# Patient Record
Sex: Male | Born: 1965 | Race: White | Hispanic: No | Marital: Married | Smoking: Former smoker
Health system: Southern US, Community
[De-identification: ages and names within clinical notes are randomized; demographics above are authoritative.]

## PROBLEM LIST (undated history)

## (undated) DIAGNOSIS — Q231 Congenital insufficiency of aortic valve: Secondary | ICD-10-CM

## (undated) DIAGNOSIS — E781 Pure hyperglyceridemia: Secondary | ICD-10-CM

## (undated) DIAGNOSIS — E785 Hyperlipidemia, unspecified: Secondary | ICD-10-CM

## (undated) DIAGNOSIS — K589 Irritable bowel syndrome without diarrhea: Secondary | ICD-10-CM

## (undated) DIAGNOSIS — G4730 Sleep apnea, unspecified: Secondary | ICD-10-CM

## (undated) DIAGNOSIS — I2510 Atherosclerotic heart disease of native coronary artery without angina pectoris: Secondary | ICD-10-CM

## (undated) DIAGNOSIS — Z9289 Personal history of other medical treatment: Secondary | ICD-10-CM

## (undated) HISTORY — PX: HERNIA REPAIR: SHX51

## (undated) HISTORY — PX: NASAL SEPTUM SURGERY: SHX37

## (undated) HISTORY — PX: CHOLECYSTECTOMY: SHX55

## (undated) HISTORY — PX: TONSILLECTOMY: SUR1361

## (undated) HISTORY — PX: CORONARY ARTERY BYPASS GRAFT: SHX141

---

## 2007-01-14 ENCOUNTER — Other Ambulatory Visit: Payer: Self-pay

## 2007-01-14 ENCOUNTER — Inpatient Hospital Stay: Payer: Self-pay | Admitting: Internal Medicine

## 2007-02-04 ENCOUNTER — Ambulatory Visit: Payer: Self-pay | Admitting: Internal Medicine

## 2007-09-08 ENCOUNTER — Ambulatory Visit: Payer: Self-pay

## 2011-05-14 ENCOUNTER — Ambulatory Visit: Payer: Self-pay | Admitting: Surgery

## 2013-01-22 ENCOUNTER — Inpatient Hospital Stay: Payer: Self-pay | Admitting: Internal Medicine

## 2013-01-22 LAB — CBC
HCT: 45.4 % (ref 40.0–52.0)
HGB: 16 g/dL (ref 13.0–18.0)
MCHC: 35.3 g/dL (ref 32.0–36.0)
MCV: 89 fL (ref 80–100)
Platelet: 276 10*3/uL (ref 150–440)
RBC: 5.1 10*6/uL (ref 4.40–5.90)

## 2013-01-22 LAB — APTT: Activated PTT: 33.2 secs (ref 23.6–35.9)

## 2013-01-22 LAB — CK-MB: CK-MB: 1.6 ng/mL (ref 0.5–3.6)

## 2013-01-22 LAB — COMPREHENSIVE METABOLIC PANEL
Bilirubin,Total: 0.4 mg/dL (ref 0.2–1.0)
Calcium, Total: 8.7 mg/dL (ref 8.5–10.1)
Chloride: 108 mmol/L — ABNORMAL HIGH (ref 98–107)
Co2: 27 mmol/L (ref 21–32)
Creatinine: 0.79 mg/dL (ref 0.60–1.30)
EGFR (African American): >60
EGFR (Non-African Amer.): >60
Glucose: 96 mg/dL (ref 65–99)
Potassium: 3.9 mmol/L (ref 3.5–5.1)
SGOT(AST): 22 U/L (ref 15–37)
Sodium: 140 mmol/L (ref 136–145)

## 2013-01-22 LAB — TROPONIN I: Troponin-I: 0.12 ng/mL — ABNORMAL HIGH

## 2013-01-22 LAB — PROTIME-INR: INR: 0.9

## 2013-01-23 LAB — TROPONIN I: Troponin-I: 0.2 ng/mL — ABNORMAL HIGH

## 2013-01-23 LAB — CK-MB: CK-MB: 1.1 ng/mL (ref 0.5–3.6)

## 2013-01-23 LAB — LIPID PANEL
Cholesterol: 157 mg/dL (ref 0–200)
VLDL Cholesterol, Calc: 34 mg/dL (ref 5–40)

## 2013-01-24 DIAGNOSIS — Q231 Congenital insufficiency of aortic valve: Secondary | ICD-10-CM

## 2013-01-24 DIAGNOSIS — E785 Hyperlipidemia, unspecified: Secondary | ICD-10-CM | POA: Insufficient documentation

## 2013-01-24 HISTORY — DX: Congenital insufficiency of aortic valve: Q23.1

## 2013-01-24 LAB — CBC WITH DIFFERENTIAL/PLATELET
Basophil %: 1.3 %
Eosinophil %: 1.7 %
HCT: 44 % (ref 40.0–52.0)
Lymphocyte #: 2.5 10*3/uL (ref 1.0–3.6)
MCV: 89 fL (ref 80–100)
Monocyte %: 11.3 %
Neutrophil #: 7.7 10*3/uL — ABNORMAL HIGH (ref 1.4–6.5)
RDW: 13.1 % (ref 11.5–14.5)

## 2013-01-24 LAB — BASIC METABOLIC PANEL
Anion Gap: 4 — ABNORMAL LOW (ref 7–16)
BUN: 14 mg/dL (ref 7–18)
Calcium, Total: 8.8 mg/dL (ref 8.5–10.1)
Chloride: 107 mmol/L (ref 98–107)
Creatinine: 0.69 mg/dL (ref 0.60–1.30)
Glucose: 96 mg/dL (ref 65–99)
Sodium: 140 mmol/L (ref 136–145)

## 2013-01-28 DIAGNOSIS — G8912 Acute post-thoracotomy pain: Secondary | ICD-10-CM | POA: Insufficient documentation

## 2013-02-02 ENCOUNTER — Emergency Department: Payer: Self-pay | Admitting: Emergency Medicine

## 2013-02-02 LAB — BASIC METABOLIC PANEL
Calcium, Total: 9.1 mg/dL (ref 8.5–10.1)
Chloride: 100 mmol/L (ref 98–107)
Co2: 33 mmol/L — ABNORMAL HIGH (ref 21–32)
Creatinine: 0.82 mg/dL (ref 0.60–1.30)
EGFR (African American): >60
Osmolality: 277 (ref 275–301)
Potassium: 4.5 mmol/L (ref 3.5–5.1)

## 2013-02-02 LAB — CBC
HGB: 12.2 g/dL — ABNORMAL LOW (ref 13.0–18.0)
MCH: 30.4 pg (ref 26.0–34.0)
MCHC: 34.3 g/dL (ref 32.0–36.0)
MCV: 89 fL (ref 80–100)
Platelet: 461 10*3/uL — ABNORMAL HIGH (ref 150–440)
RBC: 4.01 10*6/uL — ABNORMAL LOW (ref 4.40–5.90)

## 2013-02-08 LAB — CULTURE, BLOOD (SINGLE)

## 2013-03-01 DIAGNOSIS — I350 Nonrheumatic aortic (valve) stenosis: Secondary | ICD-10-CM | POA: Insufficient documentation

## 2013-03-01 DIAGNOSIS — IMO0002 Reserved for concepts with insufficient information to code with codable children: Secondary | ICD-10-CM | POA: Insufficient documentation

## 2013-03-10 ENCOUNTER — Encounter: Payer: Self-pay | Admitting: Specialist

## 2013-03-28 ENCOUNTER — Encounter: Payer: Self-pay | Admitting: Specialist

## 2013-04-20 ENCOUNTER — Emergency Department: Payer: Self-pay | Admitting: Emergency Medicine

## 2013-04-20 LAB — COMPREHENSIVE METABOLIC PANEL
Albumin: 3.6 g/dL (ref 3.4–5.0)
Alkaline Phosphatase: 116 U/L (ref 50–136)
Anion Gap: 4 — ABNORMAL LOW (ref 7–16)
Co2: 27 mmol/L (ref 21–32)
Creatinine: 0.62 mg/dL (ref 0.60–1.30)
Glucose: 97 mg/dL (ref 65–99)
SGOT(AST): 28 U/L (ref 15–37)
Sodium: 141 mmol/L (ref 136–145)
Total Protein: 7 g/dL (ref 6.4–8.2)

## 2013-04-20 LAB — TROPONIN I: Troponin-I: <0.02 ng/mL

## 2013-04-20 LAB — CBC
HGB: 14 g/dL (ref 13.0–18.0)
MCV: 85 fL (ref 80–100)
Platelet: 294 10*3/uL (ref 150–440)
RBC: 4.67 10*6/uL (ref 4.40–5.90)

## 2013-04-21 DIAGNOSIS — I2510 Atherosclerotic heart disease of native coronary artery without angina pectoris: Secondary | ICD-10-CM | POA: Insufficient documentation

## 2013-04-27 ENCOUNTER — Encounter: Payer: Self-pay | Admitting: Specialist

## 2013-05-28 ENCOUNTER — Encounter: Payer: Self-pay | Admitting: Specialist

## 2014-04-04 ENCOUNTER — Emergency Department: Payer: Self-pay | Admitting: Emergency Medicine

## 2014-11-08 ENCOUNTER — Emergency Department: Payer: Self-pay | Admitting: Internal Medicine

## 2014-12-12 DIAGNOSIS — I77810 Thoracic aortic ectasia: Secondary | ICD-10-CM | POA: Insufficient documentation

## 2015-02-17 NOTE — Consult Note (Signed)
PATIENT NAME:  Robert Haley, Robert Haley MR#:  [id number] DATE OF BIRTH:  1966-06-24  DATE OF CONSULTATION:  01/22/2013  REFERRING PHYSICIAN:  Graciella Freer, MD CONSULTING PHYSICIAN:  Corey Skains, MD  PRIMARY CARE PHYSICIAN:  Maryland Pink, MD  REASON FOR CONSULTATION: Acute subendocardial myocardial infarction.   CHIEF COMPLAINT: "I have chest pain."   HISTORY OF PRESENT ILLNESS:  This is a 49 year old male who is a heavy smoker, 1-1/2 packs per day, who has had no evidence of hyperlipidemia or hypertension, who has had progressive episodes of substernal chest discomfort, weakness, fatigue and shortness of breath  over the last 2 days resulting in needing to be seen in the Emergency Room. At that time, the patient did have relief with nitroglycerin and aspirin. The patient also has had an EKG showing normal sinus rhythm, normal EKG. The patient now has had full relief of his chest discomfort and there are no other significant symptoms at this time. Troponin is elevated to 2.1.   REVIEW OF SYSTEMS: The remainder are negative for vision change, ringing in the ears, hearing loss, cough, congestion, heartburn, nausea, vomiting, diarrhea, bloody stools, stomach pain, extremity pain, leg weakness, cramping of the buttocks, known blood clots, headaches, blackouts, dizzy spells, nosebleeds, congestion, trouble swallowing, frequent urination, urination at night, muscle weakness, numbness, anxiety, depression, skin lesions or skin rashes.   PAST MEDICAL HISTORY:  Hyperlipidemia.   FAMILY HISTORY: Father had coronary artery bypass graft at age 49.   SOCIAL HISTORY: Smokes 1 to 1-1/2 packs per day x 30 years. Denies alcohol use.   ALLERGIES: No known drug allergies.  CURRENT MEDICATIONS: As listed.   PHYSICAL EXAMINATION: VITAL SIGNS: Blood pressure is 122/66 bilaterally and heart rate 72 upright, reclining, and regular.  GENERAL: He is a well appearing male in no acute distress.  HEENT: No icterus,  thyromegaly, ulcers, hemorrhage or xanthelasma.  HEART:  Regular rate and rhythm. Normal S1 and S2 without murmur, gallop or rub. PMI is normal size and placement. Carotid upstroke is normal without bruit. Jugular venous pressure is normal.  LUNGS: Few basilar crackles with normal respirations.  ABDOMEN: Soft and nontender without hepatosplenomegaly or masses. Abdominal aorta is normal size without bruit.  EXTREMITIES: Show 2+ bilateral pulses in dorsal, pedal, radial and femoral arteries without lower extremity edema, cyanosis, clubbing or ulcers.  NEUROLOGIC: He is oriented to time, place and person with normal mood and affect.   ASSESSMENT: This is a 49 year old male with chest pain consistent with acute subendocardial myocardial infarction.   RECOMMENDATIONS: 1.  Serial ECG and enzymes to assess for extent of myocardial infarction.  2.  Proceed to cardiac catheterization to assess coronary anatomy and further treatment thereof as necessary. The patient understands the risks and benefits of cardiac catheterization. These include the possibly of death, stroke, heart attack, infection, bleeding or blood clot. The patient is at low risk for conscious sedation. ____________________________ Corey Skains, MD bjk:sb D: 01/22/2013 13:13:27 ET T: 01/22/2013 14:32:42 ET JOB#: [id number]  cc: Corey Skains, MD, <Dictator> Corey Skains MD ELECTRONICALLY SIGNED 01/24/2013 8:19

## 2015-02-17 NOTE — Discharge Summary (Signed)
PATIENT NAME:  Robert Haley, KUWAHARA MR#:  [id number] DATE OF BIRTH:  Jan 09, 1966  DATE OF ADMISSION:  01/22/2013 DATE OF TRANSFER: 01/24/2013   ADMITTING PHYSICIAN: Dr. Fritzi Mandes  DISCHARGING PHYSICAIN: Dr. Gladstone Lighter  TRANSFER ACCEPTING FACILITY: [facility].   PRIMARY CARE PHYSICIAN: Dr. Kary Kos.  CONSULTATIONS IN THE HOSPITAL: Cardiology consultation by Dr. Nehemiah Massed.   DISCHARGE DIAGNOSES: 1.  Non-ST segment elevation myocardial infarction with critical 2 vessel disease requiring coronary artery bypass graft surgery.  2.  Hyperlipidemia.  3.  Anxiety.  4.  Tobacco use disorder.   CURRENT MEDICATIONS AT THE TIME OF TRANSFER:  1.  Multivitamin 1 tablet p.o. daily.  2.  Aspirin 81 mg p.o. daily.  3.  Vitamin C daily.  4.  Echinacea orally daily.  5.  Zinc daily.   6.  Vitamin B complex 1 tablet daily.  7.  Sublingual nitroglycerin 0.4 mg as needed for chest pain.  8.  Nitroglycerin ointment 1 inch topically twice a day while in the hospital.  9.  Lovenox 115 mg subcutaneous b.i.d.  10. Atorvastatin 40 mg p.o. daily.  11. Metoprolol 25 mg b.i.d.   DISCHARGE DIET: Low fat, low sodium diet.   DISCHARGE ACTIVITY: As tolerated.    FOLLOWUP INSTRUCTIONS: The patient is being transferred to [facility] for bypass graft surgery.  LABORATORIES: Latest labs from 01/24/2013, WBC 11.9, hemoglobin 15.4, hematocrit 44.0 and platelet count 260. Sodium 140, potassium 4.0, chloride 107, bicarbonate 29, BUN 14, creatinine 0.69, glucose 96, and calcium 8.8. LDL cholesterol 96, HDL 27, triglycerides 169, total cholesterol 157. Last troponin from 01/23/2013 at midnight is 0.20 with CK-MB being 1.1. Cardiac catheterization reveals normal LV function and critical two-vessel coronary artery disease. There is an 85% proximal LAD stenosis, 60% mid LAD stenosis, 95% first obtuse marginal disease, 60% stenosis of proximal RCA and RV marginal 85% stenosis.   BRIEF HOSPITAL COURSE: The  patient is a 49 year old male with no significant past medical history other than smoking and hyperlipidemia, not taking any medications, but strong family history of coronary artery disease with a father with bypass surgery. He comes to the hospital secondary to chest pain. He already had an elevated first set of troponin at 0.12 on admission.   Non-ST segment elevation myocardial infarction. He was taken to cardiac catheterization the same day as admission  and it revealed the above findings with critical 2 vessel disease either needing complicated PCI versus coronary artery bypass graft surgery. Cardiologist, Dr. Nehemiah Massed, already spoke with Dr. Hardin Negus from [facility], who has accepted the patient. While here, the patient  was initially on a heparin drip and post cardiac catheterization he was changed to Lovenox b.i.d. He is on aspirin, beta blocker and statin at this time. His blood pressure is low-normal, so ACE inhibitor has not been started while in the hospital. His course has been otherwise uneventful in the hospital. He remained chest pain free. He was counseled about smoking cessation on admission and he remains motivated to cold [place] stop smoking. He is not on any nicotine patch or other nicotine supplements while in the hospital.   DISCHARGE CONDITION: Stable.   DISCHARGE DISPOSITION: To [facility].  TIME SPENT ON DISCHARGE: 45 minutes.     ____________________________ Gladstone Lighter, MD rk:aw D: 01/24/2013 13:16:40 ET T: 01/24/2013 13:41:48 ET JOB#: [id number]  cc: Gladstone Lighter, MD, <Dictator> Irven Easterly. Kary Kos, MD Gladstone Lighter MD ELECTRONICALLY SIGNED 02/12/2013 15:35

## 2015-02-17 NOTE — H&P (Signed)
PATIENT NAME:  Robert Haley, Robert Haley MR#:  [id number] DATE OF BIRTH:  04-23-1966  DATE OF ADMISSION:  01/22/2013  PRIMARY CARE PHYSICIAN: Dr. Kary Kos.   CHIEF COMPLAINT: Chest pain today.   HISTORY OF PRESENT ILLNESS: The patient is a 49 year old Caucasian gentleman with history of hyperlipidemia and a history of anxiety, who comes to the Emergency Room after he  started having chest pain, mid substernal, radiating to his shoulder, left jaw and his upper arm. His chest pain improved at home after taking 4 baby aspirins; however, it recurred again and he decided to come to the Emergency Room where EKG was found to be in normal sinus rhythm with sinus arrhythmia. However, his troponin was 0.12. He has significant risk factors with premature family history with quadruple bypass in father at age 98. The patient has history of tobacco abuse and hyperlipidemia. He is being admitted for non-Q-wave MI and further evaluation and management.   PAST MEDICAL HISTORY:  1.  Hyperlipidemia, not on any medications.  2.  Anxiety and depression.  3.  Chronic tobacco abuse.   MEDICATIONS: Currently: 1.  Multivitamin daily.  2.  Aspirin 81 mg daily.   ALLERGIES: MORPHINE.   FAMILY HISTORY: Premature coronary artery disease with father having quadruple bypass at age 89.   SOCIAL HISTORY: He smokes about a pack to a pack and a half daily. Nonalcoholic. He works as a Administrator.   REVIEW OF SYSTEMS:   CONSTITUTIONAL: No fever, fatigue, weakness.  EYES: No blurred or double vision.  ENT: No tinnitus, ear pain, hearing loss.  RESPIRATORY: No cough, wheeze, hemoptysis, or respiratory distress.  CARDIOVASCULAR: Positive for chest pain. N hypertension, arrhythmia or syncope.  GASTROINTESTINAL: No nausea, vomiting, diarrhea, or abdominal pain. Positive for GERD.  GENITOURINARY: No dysuria or hematuria.  ENDOCRINE: No polyuria, nocturia, or thyroid problems.  HEMATOLOGY: No anemia or easy bruising.  SKIN: No acne or  rash.  MUSCULOSKELETAL: No arthritis.  NEUROLOGIC: No CVA or transient ischemic attack.  PSYCHIATRIC: Positive for anxiety.  All other systems reviewed are negative.   EKG showed normal sinus rhythm with sinus arrhythmia. PT and INR within normal limits. CK total 92. INR 1.4. Troponin is 0.12. Chest x-ray is borderline cardiomegaly. CBC and basic metabolic panel within normal limits.    ASSESSMENT: A 49 year old patient with history of hyperlipidemia and anxiety. He comes in with:  1.  Acute non-Q-wave myocardial infarction with ongoing chest pain and multiple risk factors with excessive tobacco use, hyperlipidemia and premature coronary artery disease in the patient's father. We will admit the patient on telemetry floor. The patient is going to be started on a heparin drip. Will continue aspirin, nitroglycerin and metoprolol as blood pressure allows. Cardiology, Dr. Nehemiah Massed, has been consulting and plans to catheterize the patient later this afternoon. We will continue to cycle cardiac enzymes x 3 and check lipid profile.  2.  Hyperlipidemia. Start the patient on Lipitor for now. We will take a look at the panel and decide the dosage.  3.  Anxiety. The patient is stable at this time. Will give p.r.n. Xanax or Ativan as needed. The patient does not take any medications chronically at home.  4.  History of hyperlipidemia. The patient has been off his fenofibrate for many years. We will check lipid profile.  5.  Tobacco abuse. The patient was advised on smoking cessation, about 3 minutes spent. The patient says he is not going to smoke anymore.  6.  Deep vein thrombosis prophylaxis.  The patient is already on heparin drip.  7.  Further workup during the patient's clinical course. Hospital admission plan was discussed with the patient and his wife, who is present in the Emergency Room.   TIME SPENT: 50 minutes.    ____________________________ Robert Rochester Posey Pronto, MD sap:aw D: 01/22/2013 09:47:50  ET T: 01/22/2013 10:00:29 ET JOB#: [id number]  cc: Harryette Shuart A. Posey Pronto, MD, <Dictator> Irven Easterly. Kary Kos, MD Corey Skains, MD Ilda Basset MD ELECTRONICALLY SIGNED 01/29/2013 15:45

## 2016-03-19 ENCOUNTER — Encounter: Payer: Self-pay | Admitting: *Deleted

## 2016-03-20 ENCOUNTER — Encounter: Payer: Self-pay | Admitting: Anesthesiology

## 2016-03-20 ENCOUNTER — Ambulatory Visit
Admission: RE | Admit: 2016-03-20 | Discharge: 2016-03-20 | Disposition: A | Discharge status: Home / Self Care | Payer: PRIVATE HEALTH INSURANCE | Source: Ambulatory Visit | Attending: Unknown Physician Specialty | Admitting: Unknown Physician Specialty

## 2016-03-20 ENCOUNTER — Ambulatory Visit: Payer: PRIVATE HEALTH INSURANCE | Admitting: Anesthesiology

## 2016-03-20 ENCOUNTER — Encounter
Admission: RE | Disposition: A | Discharge status: Home / Self Care | Payer: Self-pay | Source: Ambulatory Visit | Attending: Unknown Physician Specialty

## 2016-03-20 DIAGNOSIS — Z79899 Other long term (current) drug therapy: Secondary | ICD-10-CM | POA: Insufficient documentation

## 2016-03-20 DIAGNOSIS — K589 Irritable bowel syndrome without diarrhea: Secondary | ICD-10-CM | POA: Diagnosis not present

## 2016-03-20 DIAGNOSIS — I2510 Atherosclerotic heart disease of native coronary artery without angina pectoris: Secondary | ICD-10-CM | POA: Insufficient documentation

## 2016-03-20 DIAGNOSIS — K640 First degree hemorrhoids: Secondary | ICD-10-CM | POA: Diagnosis not present

## 2016-03-20 DIAGNOSIS — E785 Hyperlipidemia, unspecified: Secondary | ICD-10-CM | POA: Diagnosis not present

## 2016-03-20 DIAGNOSIS — Z7982 Long term (current) use of aspirin: Secondary | ICD-10-CM | POA: Insufficient documentation

## 2016-03-20 DIAGNOSIS — G4730 Sleep apnea, unspecified: Secondary | ICD-10-CM | POA: Insufficient documentation

## 2016-03-20 DIAGNOSIS — K6289 Other specified diseases of anus and rectum: Secondary | ICD-10-CM | POA: Diagnosis not present

## 2016-03-20 DIAGNOSIS — K621 Rectal polyp: Secondary | ICD-10-CM | POA: Insufficient documentation

## 2016-03-20 DIAGNOSIS — D124 Benign neoplasm of descending colon: Secondary | ICD-10-CM | POA: Insufficient documentation

## 2016-03-20 DIAGNOSIS — Z1211 Encounter for screening for malignant neoplasm of colon: Secondary | ICD-10-CM | POA: Insufficient documentation

## 2016-03-20 DIAGNOSIS — K635 Polyp of colon: Secondary | ICD-10-CM | POA: Diagnosis not present

## 2016-03-20 DIAGNOSIS — Z8371 Family history of colonic polyps: Secondary | ICD-10-CM | POA: Insufficient documentation

## 2016-03-20 DIAGNOSIS — Z87891 Personal history of nicotine dependence: Secondary | ICD-10-CM | POA: Insufficient documentation

## 2016-03-20 HISTORY — DX: Irritable bowel syndrome without diarrhea: K58.9

## 2016-03-20 HISTORY — DX: Hyperlipidemia, unspecified: E78.5

## 2016-03-20 HISTORY — DX: Atherosclerotic heart disease of native coronary artery without angina pectoris: I25.10

## 2016-03-20 HISTORY — DX: Personal history of other medical treatment: Z92.89

## 2016-03-20 HISTORY — DX: Congenital insufficiency of aortic valve: Q23.1

## 2016-03-20 HISTORY — DX: Sleep apnea, unspecified: G47.30

## 2016-03-20 HISTORY — DX: Pure hyperglyceridemia: E78.1

## 2016-03-20 HISTORY — PX: COLONOSCOPY WITH PROPOFOL: SHX5780

## 2016-03-20 SURGERY — COLONOSCOPY WITH PROPOFOL
Anesthesia: General

## 2016-03-20 MED ORDER — PROPOFOL 500 MG/50ML IV EMUL
INTRAVENOUS | Status: DC | PRN
  Administered 2016-03-20: 140 ug/kg/min via INTRAVENOUS

## 2016-03-20 MED ORDER — PROPOFOL 10 MG/ML IV BOLUS
INTRAVENOUS | Status: DC | PRN
  Administered 2016-03-20: 40 mg via INTRAVENOUS
  Administered 2016-03-20: 80 mg via INTRAVENOUS

## 2016-03-20 MED ORDER — SODIUM CHLORIDE 0.9 % IV SOLN
INTRAVENOUS | Status: DC
  Administered 2016-03-20: 08:00:00 via INTRAVENOUS

## 2016-03-20 MED ORDER — PHENYLEPHRINE HCL 10 MG/ML IJ SOLN
INTRAMUSCULAR | Status: DC | PRN
  Administered 2016-03-20 (×2): 100 ug via INTRAVENOUS
  Administered 2016-03-20: 50 ug via INTRAVENOUS
  Administered 2016-03-20: 100 ug via INTRAVENOUS
  Administered 2016-03-20: 50 ug via INTRAVENOUS
  Administered 2016-03-20: 100 ug via INTRAVENOUS

## 2016-03-20 MED ORDER — SODIUM CHLORIDE 0.9 % IV SOLN: INTRAVENOUS | Status: DC

## 2016-03-20 MED ORDER — LIDOCAINE HCL (CARDIAC) 20 MG/ML IV SOLN
INTRAVENOUS | Status: DC | PRN
  Administered 2016-03-20: 40 mg via INTRAVENOUS

## 2016-03-20 NOTE — H&P (Signed)
Primary Care Physician:  Maryland Pink, MD Primary Gastroenterologist:  Dr. Vira Agar  Pre-Procedure History & Physical: HPI:  Robert Haley is a 50 y.o. male is here for an colonoscopy.   Past Medical History  Diagnosis Date  . Coronary artery disease   . Bicuspid aortic valve 01/24/2013  . Dyslipidemia   . Hypertriglyceridemia   . IBS (irritable bowel syndrome)   . Sleep apnea   . H/O transesophageal echocardiography (TEE) for monitoring     Past Surgical History  Procedure Laterality Date  . Hernia repair    . Tonsillectomy    . Nasal septum surgery    . Cholecystectomy    . Coronary artery bypass graft      Prior to Admission medications   Medication Sig Start Date End Date Taking? Authorizing Provider  aspirin 81 MG tablet Take 81 mg by mouth daily.   Yes Historical Provider, MD  atorvastatin (LIPITOR) 80 MG tablet Take 80 mg by mouth daily.   Yes Historical Provider, MD  buPROPion (WELLBUTRIN SR) 150 MG 12 hr tablet Take 150 mg by mouth 2 (two) times daily.   Yes Historical Provider, MD  clopidogrel (PLAVIX) 75 MG tablet Take 75 mg by mouth daily.   Yes Historical Provider, MD  metoprolol (LOPRESSOR) 50 MG tablet Take 50 mg by mouth 2 (two) times daily.   Yes Historical Provider, MD  Multiple Vitamin (MULTIVITAMIN) tablet Take 1 tablet by mouth daily.   Yes Historical Provider, MD  OMEGA-3 FATTY ACIDS PO Take 1,000 mg by mouth daily.   Yes Historical Provider, MD  sertraline (ZOLOFT) 100 MG tablet Take 100 mg by mouth daily.   Yes Historical Provider, MD  vitamin C (ASCORBIC ACID) 500 MG tablet Take 500 mg by mouth daily.   Yes Historical Provider, MD  zinc gluconate 50 MG tablet Take 50 mg by mouth daily.   Yes Historical Provider, MD    Allergies as of 02/13/2016  . (Not on File)    History reviewed. No pertinent family history.  Social History   Social History  . Marital Status: Married    Spouse Name: N/A  . Number of Children: N/A  . Years of Education:  N/A   Occupational History  . Not on file.   Social History Main Topics  . Smoking status: Former Research scientist (life sciences)  . Smokeless tobacco: Never Used  . Alcohol Use: No  . Drug Use: No  . Sexual Activity: Not on file   Other Topics Concern  . Not on file   Social History Narrative    Review of Systems: See HPI, otherwise negative ROS  Physical Exam: BP 122/72 mmHg  Pulse 62  Temp(Src) 97.6 F (36.4 C) (Tympanic)  Resp 18  Ht 5\' 8"  (1.727 m)  Wt 136.079 kg (300 lb)  BMI 45.63 kg/m2  SpO2 96% General:   Alert,  pleasant and cooperative in NAD Head:  Normocephalic and atraumatic. Neck:  Supple; no masses or thyromegaly. Lungs:  Clear throughout to auscultation.    Heart:  Regular rate and rhythm. Abdomen:  Soft, nontender and nondistended. Normal bowel sounds, without guarding, and without rebound.   Neurologic:  Alert and  oriented x4;  grossly normal neurologically.  Impression/Plan: Robert Haley is here for an colonoscopy to be performed for screening with family history colon polyps in mother.  Risks, benefits, limitations, and alternatives regarding  colonoscopy have been reviewed with the patient.  Questions have been answered.  All parties agreeable.  Gaylyn Cheers, MD  03/20/2016, 8:48 AM

## 2016-03-20 NOTE — Transfer of Care (Signed)
Immediate Anesthesia Transfer of Care Note  Patient: Robert Haley  Procedure(s) Performed: Procedure(s): COLONOSCOPY WITH PROPOFOL (N/A)  Patient Location: PACU  Anesthesia Type:General  Level of Consciousness: awake, alert  and oriented  Airway & Oxygen Therapy: Patient Spontanous Breathing and Patient connected to nasal cannula oxygen  Post-op Assessment: Report given to RN and Post -op Vital signs reviewed and stable  Post vital signs: Reviewed and stable  Last Vitals:  Filed Vitals:   03/20/16 0755  BP: 122/72  Pulse: 62  Temp: 36.4 C  Resp: 18    Last Pain: There were no vitals filed for this visit.       Complications: No apparent anesthesia complications

## 2016-03-20 NOTE — Anesthesia Postprocedure Evaluation (Signed)
Anesthesia Post Note  Patient: Robert Haley  Procedure(s) Performed: Procedure(s) (LRB): COLONOSCOPY WITH PROPOFOL (N/A)  Patient location during evaluation: Endoscopy Anesthesia Type: General Level of consciousness: awake and alert Pain management: pain level controlled Vital Signs Assessment: post-procedure vital signs reviewed and stable Respiratory status: spontaneous breathing, nonlabored ventilation, respiratory function stable and patient connected to nasal cannula oxygen Cardiovascular status: blood pressure returned to baseline and stable Postop Assessment: no signs of nausea or vomiting Anesthetic complications: no    Last Vitals:  Filed Vitals:   03/20/16 1003 03/20/16 1013  BP: 100/62 115/83  Pulse: 56 59  Temp:    Resp: 15 15    Last Pain: There were no vitals filed for this visit.               Kwanza Cancelliere S

## 2016-03-20 NOTE — Anesthesia Preprocedure Evaluation (Addendum)
Anesthesia Evaluation  Patient identified by MRN, date of birth, ID band Patient awake    Reviewed: Allergy & Precautions, NPO status , Patient's Chart, lab work & pertinent test results, reviewed documented beta blocker date and time   Airway Mallampati: II  TM Distance: >3 FB     Dental  (+) Chipped   Pulmonary sleep apnea , former smoker,           Cardiovascular + CAD and + CABG       Neuro/Psych    GI/Hepatic   Endo/Other    Renal/GU      Musculoskeletal   Abdominal   Peds  Hematology   Anesthesia Other Findings IBS. Will use CPAP tonite. 7 stents placed after CABG. Last set of stents about 2-3 yrs ago. On plavix.  Reproductive/Obstetrics                            Anesthesia Physical Anesthesia Plan  ASA: III  Anesthesia Plan: General   Post-op Pain Management:    Induction: Intravenous  Airway Management Planned: Nasal Cannula  Additional Equipment:   Intra-op Plan:   Post-operative Plan:   Informed Consent: I have reviewed the patients History and Physical, chart, labs and discussed the procedure including the risks, benefits and alternatives for the proposed anesthesia with the patient or authorized representative who has indicated his/her understanding and acceptance.     Plan Discussed with: CRNA  Anesthesia Plan Comments:         Anesthesia Quick Evaluation

## 2016-03-20 NOTE — Op Note (Signed)
[facility] Patient Name: Robert Haley Procedure Date: 03/20/2016 8:55 AM MRN: [id number] Account #: [id number] Date of Birth: 1966/07/06 Admit Type: Outpatient Age: 50 Room: [facility] ENDO ROOM 4 Gender: Male Note Status: Finalized Procedure:            Colonoscopy Indications:          Colon cancer screening in patient at increased risk:                        Family history of 1st-degree relative with colon polyps Providers:            Manya Silvas, MD Referring MD:         Irven Easterly. Kary Kos, MD (Referring MD) Medicines:            Propofol per Anesthesia Complications:        No immediate complications. Procedure:            Pre-Anesthesia Assessment:                       - After reviewing the risks and benefits, the patient                        was deemed in satisfactory condition to undergo the                        procedure.                       After obtaining informed consent, the colonoscope was                        passed under direct vision. Throughout the procedure,                        the patient's blood pressure, pulse, and oxygen                        saturations were monitored continuously. The                        Colonoscope was introduced through the anus and                        advanced to the the cecum, identified by appendiceal                        orifice and ileocecal valve. The colonoscopy was                        performed without difficulty. The patient tolerated the                        procedure well. The quality of the bowel preparation                        was good. Findings:      Eight sessile polyps were found in the transverse colon and ascending       colon. The polyps were diminutive in size. These polyps were removed       with a jumbo cold forceps. Resection and retrieval  were complete.      Two sessile polyps were found in the splenic flexure. The polyps were       diminutive in size.  These polyps were removed with a jumbo cold forceps.       Resection and retrieval were complete.      A diminutive polyp was found in the splenic flexure. The polyp was       sessile. The polyp was removed with a jumbo cold forceps. Resection and       retrieval were complete.      A diminutive polyp was found in the descending colon. The polyp was       sessile. The polyp was removed with a jumbo cold forceps. Resection and       retrieval were complete.      Six sessile polyps were found in the sigmoid colon. The polyps were       diminutive in size. These polyps were removed with a jumbo cold forceps.       Resection and retrieval were complete.      A diminutive polyp was found in the rectum. The polyp was sessile. The       polyp was removed with a jumbo cold forceps. Resection and retrieval       were complete.      Internal hemorrhoids were found during endoscopy. The hemorrhoids were       small and Grade I (internal hemorrhoids that do not prolapse).      The exam was otherwise without abnormality. Impression:           - Eight diminutive polyps in the transverse colon and                        in the ascending colon, removed with a jumbo cold                        forceps. Resected and retrieved.                       - Two diminutive polyps at the splenic flexure, removed                        with a jumbo cold forceps. Resected and retrieved.                       - One diminutive polyp at the splenic flexure, removed                        with a jumbo cold forceps. Resected and retrieved.                       - One diminutive polyp in the descending colon, removed                        with a jumbo cold forceps. Resected and retrieved.                       - Six diminutive polyps in the sigmoid colon, removed                        with a jumbo cold forceps. Resected and retrieved.                       -  One diminutive polyp in the rectum, removed with a                         jumbo cold forceps. Resected and retrieved.                       - Internal hemorrhoids.                       - The examination was otherwise normal. Recommendation:       - Await pathology results. Manya Silvas, MD 03/20/2016 9:37:23 AM This report has been signed electronically. Number of Addenda: 0 Note Initiated On: 03/20/2016 8:55 AM Scope Withdrawal Time: 0 hours 29 minutes 16 seconds  Total Procedure Duration: 0 hours 32 minutes 42 seconds       [facility]

## 2016-03-20 NOTE — Anesthesia Procedure Notes (Signed)
Performed by: Chee Dimon Pre-anesthesia Checklist: Patient identified, Emergency Drugs available, Suction available, Timeout performed and Patient being monitored Patient Re-evaluated:Patient Re-evaluated prior to inductionOxygen Delivery Method: Nasal cannula Intubation Type: IV induction       

## 2016-03-21 LAB — SURGICAL PATHOLOGY

## 2016-03-22 ENCOUNTER — Encounter: Payer: Self-pay | Admitting: Unknown Physician Specialty

## 2016-07-11 ENCOUNTER — Emergency Department
Admission: EM | Admit: 2016-07-11 | Discharge: 2016-07-11 | Payer: PRIVATE HEALTH INSURANCE | Attending: Emergency Medicine | Admitting: Emergency Medicine

## 2016-07-11 ENCOUNTER — Encounter: Payer: Self-pay | Admitting: Medical Oncology

## 2016-07-11 ENCOUNTER — Emergency Department: Payer: PRIVATE HEALTH INSURANCE

## 2016-07-11 ENCOUNTER — Ambulatory Visit (HOSPITAL_COMMUNITY)
Admission: AD | Admit: 2016-07-11 | Discharge: 2016-07-11 | Disposition: A | Discharge status: Home / Self Care | Payer: PRIVATE HEALTH INSURANCE | Source: Other Acute Inpatient Hospital | Attending: Emergency Medicine | Admitting: Emergency Medicine

## 2016-07-11 DIAGNOSIS — I200 Unstable angina: Secondary | ICD-10-CM

## 2016-07-11 DIAGNOSIS — I213 ST elevation (STEMI) myocardial infarction of unspecified site: Secondary | ICD-10-CM | POA: Insufficient documentation

## 2016-07-11 DIAGNOSIS — Z87891 Personal history of nicotine dependence: Secondary | ICD-10-CM | POA: Insufficient documentation

## 2016-07-11 DIAGNOSIS — R079 Chest pain, unspecified: Secondary | ICD-10-CM

## 2016-07-11 DIAGNOSIS — I350 Nonrheumatic aortic (valve) stenosis: Secondary | ICD-10-CM | POA: Insufficient documentation

## 2016-07-11 DIAGNOSIS — I252 Old myocardial infarction: Secondary | ICD-10-CM | POA: Diagnosis not present

## 2016-07-11 DIAGNOSIS — Z6841 Body Mass Index (BMI) 40.0 and over, adult: Secondary | ICD-10-CM

## 2016-07-11 DIAGNOSIS — Z7982 Long term (current) use of aspirin: Secondary | ICD-10-CM | POA: Insufficient documentation

## 2016-07-11 DIAGNOSIS — Z79899 Other long term (current) drug therapy: Secondary | ICD-10-CM | POA: Diagnosis not present

## 2016-07-11 DIAGNOSIS — R0602 Shortness of breath: Secondary | ICD-10-CM | POA: Diagnosis present

## 2016-07-11 DIAGNOSIS — I25110 Atherosclerotic heart disease of native coronary artery with unstable angina pectoris: Secondary | ICD-10-CM | POA: Diagnosis not present

## 2016-07-11 LAB — CBC
HCT: 39.9 % — ABNORMAL LOW (ref 40.0–52.0)
Hemoglobin: 14.1 g/dL (ref 13.0–18.0)
MCH: 30.2 pg (ref 26.0–34.0)
MCHC: 35.3 g/dL (ref 32.0–36.0)
MCV: 85.7 fL (ref 80.0–100.0)
Platelets: 219 10*3/uL (ref 150–440)
RBC: 4.66 MIL/uL (ref 4.40–5.90)
RDW: 14.7 % — ABNORMAL HIGH (ref 11.5–14.5)
WBC: 6.9 10*3/uL (ref 3.8–10.6)

## 2016-07-11 LAB — PROTIME-INR
INR: 1
PROTHROMBIN TIME: 13.2 s (ref 11.4–15.2)

## 2016-07-11 LAB — APTT: aPTT: 29 seconds (ref 24–36)

## 2016-07-11 LAB — BASIC METABOLIC PANEL
Anion gap: 7 (ref 5–15)
BUN: 20 mg/dL (ref 6–20)
CHLORIDE: 105 mmol/L (ref 101–111)
CO2: 29 mmol/L (ref 22–32)
Calcium: 9.1 mg/dL (ref 8.9–10.3)
Creatinine, Ser: 0.89 mg/dL (ref 0.61–1.24)
GFR calc non Af Amer: >60 mL/min (ref >60)
Glucose, Bld: 96 mg/dL (ref 65–99)
Potassium: 3.9 mmol/L (ref 3.5–5.1)
Sodium: 141 mmol/L (ref 135–145)

## 2016-07-11 LAB — TROPONIN I
Troponin I: <0.03 ng/mL (ref <0.03)
Troponin I: <0.03 ng/mL (ref <0.03)

## 2016-07-11 MED ORDER — HEPARIN (PORCINE) IN NACL 100-0.45 UNIT/ML-% IJ SOLN
1300.0000 [IU]/h | INTRAMUSCULAR | Status: DC
  Administered 2016-07-11: 1300 [IU]/h via INTRAVENOUS
  Filled 2016-07-11 (×2): qty 250

## 2016-07-11 MED ORDER — ASPIRIN 81 MG PO CHEW
324.0000 mg | CHEWABLE_TABLET | Freq: Once | ORAL | Status: AC
  Administered 2016-07-11: 324 mg via ORAL
  Filled 2016-07-11: qty 4

## 2016-07-11 MED ORDER — HEPARIN BOLUS VIA INFUSION
4000.0000 [IU] | Freq: Once | INTRAVENOUS | Status: AC
  Administered 2016-07-11: 4000 [IU] via INTRAVENOUS
  Filled 2016-07-11: qty 4000

## 2016-07-11 MED ORDER — HEPARIN SODIUM (PORCINE) 5000 UNIT/ML IJ SOLN: 4000.0000 [IU] | Freq: Once | INTRAMUSCULAR | Status: DC

## 2016-07-11 NOTE — ED Triage Notes (Signed)
Pt reports he began having left sided jaw pain about 1 hr pta. Pt denies chest pain/arm pain. Presents stating this is the same pain he had in 2014 when he had a MI. Pt also reports sob. Took NGT around 1215 with minimal relief of pain.

## 2016-07-11 NOTE — ED Notes (Signed)
STILL WAITING ON THE DR FROM [facility] TO CALL. SPOKE TO [place] @ 6:06PM AND STATED THAT THEY ARE STILL WAITING ON THE DR TO CALL THEM BACK.

## 2016-07-11 NOTE — Progress Notes (Signed)
ANTICOAGULATION CONSULT NOTE - Initial Consult  Pharmacy Consult for heparin Indication: chest pain/ACS  Allergies  Allergen Reactions  . Definity [Perflutren Lipid Microsphere]   . Morphine And Related     Patient Measurements: Height: 5\' 9"  (175.3 cm) Weight: 300 lb (136.1 kg) IBW/kg (Calculated) : 70.7 Heparin Dosing Weight: 103 kg  Vital Signs: Temp: 98.4 F (36.9 C) (09/14 1242) Temp Source: Oral (09/14 1242) BP: 126/82 (09/14 1730) Pulse Rate: 55 (09/14 1730)  Labs:  Recent Labs  07/11/16 1246 07/11/16 1701  HGB 14.1  --   HCT 39.9*  --   PLT 219  --   CREATININE 0.89  --   TROPONINI <0.03 <0.03    Estimated Creatinine Clearance: 136.1 mL/min (by C-G formula based on SCr of 0.89 mg/dL).   Medical History: Past Medical History:  Diagnosis Date  . Bicuspid aortic valve 01/24/2013  . Coronary artery disease   . Dyslipidemia   . H/O transesophageal echocardiography (TEE) for monitoring   . Hypertriglyceridemia   . IBS (irritable bowel syndrome)   . Sleep apnea     Assessment: 50 yo male with left sided jaw pain and SOB. Pharmacy consulted for heparin dosing and monitoring.  Goal of Therapy:  Heparin level 0.3-0.7 units/ml Monitor platelets by anticoagulation protocol: Yes   Plan:  Baseline aPTT and PT/INR ordered Give 4000 units bolus x 1 Start heparin infusion at 1300 units/hr Check anti-Xa level in 6 hours and daily while on heparin Continue to monitor H&H and platelets  Nancy Fetter, PharmD Clinical Pharmacist 07/11/2016 6:11 PM

## 2016-07-11 NOTE — ED Provider Notes (Signed)
[facility] Emergency Department Provider Note  ____________________________________________   First MD Initiated Contact with Patient 07/11/16 1628     (approximate)  I have reviewed the triage vital signs and the nursing notes.   HISTORY  Chief Complaint Jaw Pain and Shortness of Breath    HPI Robert Haley is a 50 y.o. male with a history of coronary artery disease status post MI in 2014 and with multiple stents on dual antiplatelet therapy as well as significant aortic stenosis managed by Dr. Lurline Del at [facility] who presents by private vehicle for evaluation of chest pain. He reports that he does not typically have chest pain.  He occasionally has more mild episodes when he is mowing the lawn or otherwise exerting himself.  However he reports that today at approximately 11:30 AM he was driving his car and had acute onset of aching, throbbing pain in his left jaw and left shoulder that feels similar to when he had his prior MI.  It lasted about 30-40 minutes and then went away.  A nitroglycerin helped for a few minutes.  He had another episode this afternoon before coming to the emergency department.  It lasted about the same period of time and went away completely prior to triage.  He is currently chest pain-free.  He reports a very small amount of shortness of breath associated with the pain.  He denies nausea, vomiting, abdominal pain.  The pain is located primarily in his left shoulder but also involves the left chest and left jaw.  The particular makes it better nor worse.  This is the first time it is ever happen at rest and it happened twice at rest today.   Past Medical History:  Diagnosis Date  . Bicuspid aortic valve 01/24/2013  . Coronary artery disease   . Dyslipidemia   . H/O transesophageal echocardiography (TEE) for monitoring   . Hypertriglyceridemia   . IBS (irritable bowel syndrome)   . Sleep apnea     There are no active problems to  display for this patient.   Past Surgical History:  Procedure Laterality Date  . CHOLECYSTECTOMY    . COLONOSCOPY WITH PROPOFOL N/A 03/20/2016   Procedure: COLONOSCOPY WITH PROPOFOL;  Surgeon: Manya Silvas, MD;  Location: [facility] ENDOSCOPY;  Service: Endoscopy;  Laterality: N/A;  . CORONARY ARTERY BYPASS GRAFT    . HERNIA REPAIR    . NASAL SEPTUM SURGERY    . TONSILLECTOMY      Prior to Admission medications   Medication Sig Start Date End Date Taking? Authorizing Provider  aspirin 81 MG tablet Take 81 mg by mouth daily.    Historical Provider, MD  atorvastatin (LIPITOR) 80 MG tablet Take 80 mg by mouth daily.    Historical Provider, MD  buPROPion (WELLBUTRIN SR) 150 MG 12 hr tablet Take 150 mg by mouth 2 (two) times daily.    Historical Provider, MD  clopidogrel (PLAVIX) 75 MG tablet Take 75 mg by mouth daily.    Historical Provider, MD  metoprolol (LOPRESSOR) 50 MG tablet Take 50 mg by mouth 2 (two) times daily.    Historical Provider, MD  Multiple Vitamin (MULTIVITAMIN) tablet Take 1 tablet by mouth daily.    Historical Provider, MD  OMEGA-3 FATTY ACIDS PO Take 1,000 mg by mouth daily.    Historical Provider, MD  sertraline (ZOLOFT) 100 MG tablet Take 100 mg by mouth daily.    Historical Provider, MD  vitamin C (ASCORBIC ACID) 500  MG tablet Take 500 mg by mouth daily.    Historical Provider, MD  zinc gluconate 50 MG tablet Take 50 mg by mouth daily.    Historical Provider, MD    Allergies Definity [perflutren lipid microsphere] and Morphine and related  No family history on file.  Social History Social History  Substance Use Topics  . Smoking status: Former Research scientist (life sciences)  . Smokeless tobacco: Never Used  . Alcohol use No    Review of Systems Constitutional: No fever/chills Eyes: No visual changes. ENT: No sore throat. Cardiovascular: +chest pain. Respiratory: slight SOB w/ the chest pain Gastrointestinal: No abdominal pain.  No nausea, no vomiting.  No diarrhea.  No  constipation. Genitourinary: Negative for dysuria. Musculoskeletal: Negative for back pain. Skin: Negative for rash. Neurological: Negative for headaches, focal weakness or numbness.  10-point ROS otherwise negative.  ____________________________________________   PHYSICAL EXAM:  VITAL SIGNS: ED Triage Vitals  Enc Vitals Group     BP 07/11/16 1242 110/64     Pulse Rate 07/11/16 1242 (!) 59     Resp 07/11/16 1242 18     Temp 07/11/16 1242 98.4 F (36.9 C)     Temp Source 07/11/16 1242 Oral     SpO2 07/11/16 1242 97 %     Weight 07/11/16 1244 300 lb (136.1 kg)     Height 07/11/16 1244 5\' 9"  (1.753 m)     Head Circumference --      Peak Flow --      Pain Score 07/11/16 1244 3     Pain Loc --      Pain Edu? --      Excl. in [place]? --     Constitutional: Alert and oriented. Well appearing and in no acute distress. Eyes: Conjunctivae are normal. PERRL. EOMI. Head: Atraumatic. Nose: No congestion/rhinnorhea. Mouth/Throat: Mucous membranes are moist.  Oropharynx non-erythematous. Neck: No stridor.  No meningeal signs.   Cardiovascular: Normal rate, regular rhythm. Good peripheral circulation. Grossly normal heart sounds. No reproducible chest wall tenderness. Respiratory: Normal respiratory effort.  No retractions. Lungs CTAB. Gastrointestinal: Morbid obesity. Soft and nontender. No distention.  Musculoskeletal: No lower extremity tenderness nor edema. No gross deformities of extremities. Neurologic:  Normal speech and language. No gross focal neurologic deficits are appreciated.  Skin:  Skin is warm, dry and intact. No rash noted. Psychiatric: Mood and affect are normal. Speech and behavior are normal.  ____________________________________________   LABS (all labs ordered are listed, but only abnormal results are displayed)  Labs Reviewed  CBC - Abnormal; Notable for the following:       Result Value   HCT 39.9 (*)    RDW 14.7 (*)    All other components within normal  limits  BASIC METABOLIC PANEL  TROPONIN I  TROPONIN I  APTT  PROTIME-INR  HEPARIN LEVEL (UNFRACTIONATED)   ____________________________________________  EKG  ED ECG REPORT I, Eisen Robenson, the attending physician, personally viewed and interpreted this ECG.  Date: 07/11/2016 EKG Time: 12:41 Rate: 59 Rhythm: Borderline sinus bradycardia QRS Axis: normal Intervals: normal ST/T Wave abnormalities: Inverted T waves in leads V1, V2, V3, and V4 with 1 mm ST depression in leads V2 and V3.  These changes are new when compared to an EKG from 04/20/2013 which was after his MI in March 2014. Conduction Disturbances: none Narrative Interpretation: Concerning for possible acute ischemia in the anterior leads, changed from prior  ____________________________________________  RADIOLOGY   Dg Chest 2 View  Result Date: 07/11/2016 CLINICAL  DATA:  Left jaw pain over the last few years, preop for aortic valve replacement EXAM: CHEST  2 VIEW COMPARISON:  Portable chest x-ray of 04/20/2013 FINDINGS: No active infiltrate or effusion is seen. The lungs are not optimally aerated. Mild linear atelectasis or scarring is present at the right lung base. The heart is mildly enlarged and stable. Median sternotomy sutures are present from prior CABG. IMPRESSION: Suboptimal inspiration. No active process. Stable mild cardiomegaly. Electronically Signed   By: Ivar Drape M.D.   On: 07/11/2016 13:28    ____________________________________________   PROCEDURES  Procedure(s) performed:   .Critical Care Performed by: Hinda Kehr Authorized by: Hinda Kehr   Critical care provider statement:    Critical care time (minutes):  45   Critical care time was exclusive of:  Separately billable procedures and treating other patients   Critical care was necessary to treat or prevent imminent or life-threatening deterioration of the following conditions:  Cardiac failure and circulatory failure   Critical care  was time spent personally by me on the following activities:  Development of treatment plan with patient or surrogate, discussions with consultants, evaluation of patient's response to treatment, examination of patient, obtaining history from patient or surrogate, ordering and performing treatments and interventions, ordering and review of laboratory studies, ordering and review of radiographic studies, pulse oximetry, re-evaluation of patient's condition and review of old charts      Critical Care performed: Yes, see critical care procedure note(s) ____________________________________________   INITIAL IMPRESSION / [employer] / ED COURSE  Pertinent labs & imaging results that were available during my care of the patient were reviewed by me and considered in my medical decision making (see chart for details).  High risk chest pain, most consistent with unstable angina (given negative troponin) but with ECG changes compared to our last ECG on record.  Unable to access images of ECGs at [facility] through [facility].  Attempting to contact Dr. Ronnald Ramp by phone to discuss.  Asymptomatic at this time.     Clinical Course  Value Comment By Time   Spoke by phone with Dr. Ronnald Ramp.  He agreed that the patient needs to be transferred to [facility] for chest pain evaluation due to ACS/CAD vs aortic stenosis.  He will call [facility] to facilitate transfer.  Agreed with my plan for ASA and heparin bolus + infusion. Hinda Kehr, MD 09/14 1724   Spoke with [facility] transfer center, awaiting callback from cardiology. Hinda Kehr, MD 09/14 1733  Troponin I: <0.03 Second troponin is negative.  Still awaiting callback from [facility] transfer center/cardiology.  Currently asymptomatic. Hinda Kehr, MD 09/14 Dionicia Abler   Spoke with [facility] cardiologist Dr. Bennetta Laos, accepted to the service of Dr. Michaelle Birks.  Awaiting bed assignment.  [facility] transport is acceptable. Hinda Kehr, MD 09/14 1828     ____________________________________________  FINAL CLINICAL IMPRESSION(S) / ED DIAGNOSES  Final diagnoses:  Chest pain, unspecified chest pain type  Aortic stenosis  Unstable angina (HCC)     MEDICATIONS GIVEN DURING THIS VISIT:  Medications  heparin bolus via infusion 4,000 Units     Followed by  heparin ADULT infusion 100 units/mL (25000 units/234mL sodium chloride 0.45%)   aspirin chewable tablet 324 mg (324 mg Oral Given 07/11/16 1804)     NEW OUTPATIENT MEDICATIONS STARTED DURING THIS VISIT:  New Prescriptions   No medications on file    Modified Medications   No medications on file    Discontinued Medications   No medications  on file     Note:  This document was prepared using Dragon voice recognition software and may include unintentional dictation errors.    Hinda Kehr, MD 07/11/16 [phone]

## 2016-07-11 NOTE — ED Notes (Signed)
[facility] CALLED BACK AND SPOKE WITH DR [facility]. PT HAS BEEN ACCEPTED AND IS ON WAITLIST FOR A BED ASSIGNMENT.

## 2016-07-25 DIAGNOSIS — S301XXA Contusion of abdominal wall, initial encounter: Secondary | ICD-10-CM | POA: Insufficient documentation

## 2017-01-02 DIAGNOSIS — M542 Cervicalgia: Secondary | ICD-10-CM | POA: Insufficient documentation

## 2017-03-18 DIAGNOSIS — Z952 Presence of prosthetic heart valve: Secondary | ICD-10-CM | POA: Insufficient documentation

## 2018-10-08 DIAGNOSIS — K589 Irritable bowel syndrome without diarrhea: Secondary | ICD-10-CM | POA: Insufficient documentation

## 2018-10-08 DIAGNOSIS — G4730 Sleep apnea, unspecified: Secondary | ICD-10-CM | POA: Insufficient documentation

## 2018-10-09 ENCOUNTER — Encounter: Payer: Self-pay | Admitting: Podiatry

## 2018-10-09 ENCOUNTER — Other Ambulatory Visit: Payer: Self-pay | Admitting: Podiatry

## 2018-10-09 ENCOUNTER — Ambulatory Visit (INDEPENDENT_AMBULATORY_CARE_PROVIDER_SITE_OTHER): Payer: 59

## 2018-10-09 ENCOUNTER — Ambulatory Visit (INDEPENDENT_AMBULATORY_CARE_PROVIDER_SITE_OTHER): Payer: 59 | Admitting: Podiatry

## 2018-10-09 VITALS — BP 154/90 | HR 74

## 2018-10-09 DIAGNOSIS — M7661 Achilles tendinitis, right leg: Secondary | ICD-10-CM

## 2018-10-09 DIAGNOSIS — M7752 Other enthesopathy of left foot: Secondary | ICD-10-CM

## 2018-10-09 DIAGNOSIS — M79671 Pain in right foot: Secondary | ICD-10-CM

## 2018-10-09 DIAGNOSIS — M79672 Pain in left foot: Secondary | ICD-10-CM

## 2018-10-09 DIAGNOSIS — M7662 Achilles tendinitis, left leg: Secondary | ICD-10-CM

## 2018-10-09 MED ORDER — METHYLPREDNISOLONE 4 MG PO TBPK: ORAL_TABLET | ORAL | 0 refills | Status: DC

## 2018-10-13 NOTE — Progress Notes (Signed)
   HPI: 52 year old male presenting today as a new patient with a chief complaint of shooting pain to the posterior heels bilaterally that has been ongoing for the past few months. He reports throbbing pain at night and states walking increases the pain during the day. He reports an associated painful nodule to the left heel. He has been using OTC orthotics with minimal relief. Patient is currently on anticoagulant therapy. Patient is here for further evaluation and treatment.   Past Medical History:  Diagnosis Date  . Bicuspid aortic valve 01/24/2013  . Coronary artery disease   . Dyslipidemia   . H/O transesophageal echocardiography (TEE) for monitoring   . Hypertriglyceridemia   . IBS (irritable bowel syndrome)   . Sleep apnea       Physical Exam: General: The patient is alert and oriented x3 in no acute distress.  Dermatology: Skin is warm, dry and supple bilateral lower extremities. Negative for open lesions or macerations.  Vascular: Palpable pedal pulses bilaterally. No edema or erythema noted. Capillary refill within normal limits.  Neurological: Epicritic and protective threshold grossly intact bilaterally.   Musculoskeletal Exam: Pain on palpation noted to the posterior tubercle of the bilateral calcaneus at the insertion of the Achilles tendon consistent with retrocalcaneal bursitis. Range of motion within normal limits. Muscle strength 5/5 in all muscle groups bilateral lower extremities.  Radiographic Exam:  Small posterior calcaneal spur noted to the left calcaneus on lateral view. No fracture or dislocation noted. Normal osseous mineralization noted.     Assessment: 1. Achilles tendinitis bilateral 2. Retrocalcaneal bursitis left    Plan of Care:  1. Patient was evaluated. Radiographs were reviewed today. 2. Injection of 0.5 mL Celestone Soluspan injected into the left retrocalcaneal bursa. Care was taken to avoid direct injection into the Achilles tendon. 3.  Continue using insoles he got from [employer]  4. Stressed importance of daily stretching.  5. Silicone heel sleeve dispensed.  6. Prescription for Medrol Dose Pak provided to patient. 7. Cannot take NSAIDs (currently on Eliquis).  8. Return to clinic in 4 weeks.    Edrick Kins, DPM [facility]  Dr. Edrick Kins, [address]                                        [place],  [address]                Office [phone]  Fax [phone]

## 2018-11-10 ENCOUNTER — Ambulatory Visit: Payer: 59 | Admitting: Podiatry

## 2018-11-17 ENCOUNTER — Encounter: Payer: Self-pay | Admitting: Podiatry

## 2018-11-17 ENCOUNTER — Ambulatory Visit (INDEPENDENT_AMBULATORY_CARE_PROVIDER_SITE_OTHER): Payer: 59 | Admitting: Podiatry

## 2018-11-17 DIAGNOSIS — M7661 Achilles tendinitis, right leg: Secondary | ICD-10-CM

## 2018-11-17 DIAGNOSIS — M7662 Achilles tendinitis, left leg: Secondary | ICD-10-CM

## 2018-11-17 MED ORDER — NONFORMULARY OR COMPOUNDED ITEM: 2 refills | Status: DC

## 2018-11-24 NOTE — Progress Notes (Signed)
   HPI: 53 year old male presenting today for follow up evaluation of bilateral Achilles tendinitis. He states the left lower extremity has improved significantly. He reports continued pain in the right. He has been stretching daily and using the OTC insoles he has. Being on the foot for long periods of time increases the pain. Patient is here for further evaluation and treatment.   Past Medical History:  Diagnosis Date  . Bicuspid aortic valve 01/24/2013  . Coronary artery disease   . Dyslipidemia   . H/O transesophageal echocardiography (TEE) for monitoring   . Hypertriglyceridemia   . IBS (irritable bowel syndrome)   . Sleep apnea       Physical Exam: General: The patient is alert and oriented x3 in no acute distress.  Dermatology: Skin is warm, dry and supple bilateral lower extremities. Negative for open lesions or macerations.  Vascular: Palpable pedal pulses bilaterally. No edema or erythema noted. Capillary refill within normal limits.  Neurological: Epicritic and protective threshold grossly intact bilaterally.   Musculoskeletal Exam: Pain on palpation noted to the posterior tubercle of the right calcaneus at the insertion of the Achilles tendon consistent with retrocalcaneal bursitis. Range of motion within normal limits. Muscle strength 5/5 in all muscle groups bilateral lower extremities.  Assessment: 1. Achilles tendinitis right 2. Retrocalcaneal bursitis left - resolved    Plan of Care:  1. Patient was evaluated.  2. Injection of 0.5 mL Celestone Soluspan injected into the retrocalcaneal bursa. Care was taken to avoid direct injection into the Achilles tendon. 3. Continue daily stretching.  4. Continue using insoles from the [employer]  5. Prescription for antiinflammatory pain cream to be dispensed by [facility].  6. Cannot take NSAIDs (currently on Plavix).  7. Return to clinic as needed.     Edrick Kins, DPM [facility]  Dr. Edrick Kins, [address]                                        [place], [place] [address]                Office [phone]  Fax [phone]

## 2018-12-29 ENCOUNTER — Encounter: Payer: Self-pay | Admitting: Podiatry

## 2018-12-29 ENCOUNTER — Ambulatory Visit (INDEPENDENT_AMBULATORY_CARE_PROVIDER_SITE_OTHER): Payer: 59 | Admitting: Podiatry

## 2018-12-29 ENCOUNTER — Ambulatory Visit (INDEPENDENT_AMBULATORY_CARE_PROVIDER_SITE_OTHER): Payer: 59

## 2018-12-29 DIAGNOSIS — M7662 Achilles tendinitis, left leg: Secondary | ICD-10-CM

## 2018-12-29 NOTE — Progress Notes (Signed)
   HPI: Patient presents today for follow-up evaluation and treatment regarding Achilles tendinitis to the bilateral lower extremities.  Left worse than right.  Patient states that shortly after his last visit on 11/17/2018 he was stepping down some stairs and he felt a pop to the Achilles tendon of his left lower extremity.  He immediately fell to the floor and was unable to bear weight.  Prior to the injury he had noticed a significant amount of pain to the left Achilles tendon during the day.  He presents for further treatment evaluation  Past Medical History:  Diagnosis Date  . Bicuspid aortic valve 01/24/2013  . Coronary artery disease   . Dyslipidemia   . H/O transesophageal echocardiography (TEE) for monitoring   . Hypertriglyceridemia   . IBS (irritable bowel syndrome)   . Sleep apnea      Physical Exam: General: The patient is alert and oriented x3 in no acute distress.  Dermatology: Skin is warm, dry and supple bilateral lower extremities. Negative for open lesions or macerations.  Vascular: Palpable pedal pulses bilaterally. No edema or erythema noted. Capillary refill within normal limits.  Neurological: Epicritic and protective threshold grossly intact bilaterally.   Musculoskeletal Exam: Range of motion within normal limits to all pedal and ankle joints bilateral. Muscle strength 5/5 in all groups bilateral, especially noted with plantar flexion left lower extremity.  There is significant amount of pain on palpation along the medial aspect of the posterior tubercle of the calcaneus along the medial insertion of the Achilles tendon.  Negative Thompson sign  Radiographic Exam:  Normal osseous mineralization. Joint spaces preserved. No fracture/dislocation/boney destruction.    Assessment: 1.  Possible partial Achilles tendon rupture left 2.  Insertional Achilles tendinitis bilateral lower extremities. L > R   Plan of Care:  1. Patient evaluated. X-Rays reviewed.  2.   Today we can order an MRI left ankle due to the acute injury and audible pop.  Suspect possible partial Achilles rupture. 3.  Patient cannot wear an immobilization cam boot to work.  He is going to continue wearing his good supportive sneakers 4.  Patient is on anticoagulant medication and cannot take oral NSAIDs. 5.  Continue insoles from the [employer] 6.  Continue anti-inflammatory pain cream through [facility] 7.  Return to clinic in 3 weeks to review MRI results      Edrick Kins, DPM [facility]  Dr. Edrick Kins, DPM    [address], [place] [address]                Office [phone]  Fax [phone]

## 2018-12-31 ENCOUNTER — Telehealth: Payer: Self-pay

## 2018-12-31 NOTE — Telephone Encounter (Signed)
-----   Message from Edrick Kins, DPM sent at 12/29/2018  9:01 AM EST ----- Regarding: MRI left ankle Please order MRI left ankle  Dx: possible partial achilles rupture left  Thx, Dr. Amalia Hailey

## 2018-12-31 NOTE — Telephone Encounter (Signed)
MRI approved from 12/31/2018 to 02/14/2019 Auth# [id number]  Patient has been notified of approval and will call to set up his appt to his convenience.

## 2018-12-31 NOTE — Addendum Note (Signed)
Addended by: Graceann Congress D on: 12/31/2018 10:18 AM   Modules accepted: Orders

## 2019-01-19 ENCOUNTER — Ambulatory Visit: Payer: 59 | Admitting: Podiatry

## 2019-03-09 ENCOUNTER — Other Ambulatory Visit: Payer: Self-pay

## 2019-03-09 DIAGNOSIS — M7662 Achilles tendinitis, left leg: Secondary | ICD-10-CM

## 2019-03-12 ENCOUNTER — Telehealth: Payer: Self-pay | Admitting: *Deleted

## 2019-03-12 NOTE — Telephone Encounter (Signed)
I returned call to [place] and informed her that per [facility]

## 2019-03-12 NOTE — Telephone Encounter (Signed)
Robert Haley states pt's [employer] PA has expired and needs to be updated, the [employer] is fine. I asked Myra for Reference # or Case# and she got kicked out of the system. I gave her A. [employer] number [phone].

## 2019-03-15 ENCOUNTER — Ambulatory Visit
Admission: RE | Admit: 2019-03-15 | Discharge: 2019-03-15 | Disposition: A | Discharge status: Home / Self Care | Payer: PRIVATE HEALTH INSURANCE | Source: Ambulatory Visit | Attending: Podiatry | Admitting: Podiatry

## 2019-03-15 ENCOUNTER — Other Ambulatory Visit: Payer: Self-pay

## 2019-03-15 DIAGNOSIS — M7662 Achilles tendinitis, left leg: Secondary | ICD-10-CM | POA: Diagnosis not present

## 2019-04-23 ENCOUNTER — Ambulatory Visit (INDEPENDENT_AMBULATORY_CARE_PROVIDER_SITE_OTHER): Payer: PRIVATE HEALTH INSURANCE | Admitting: Podiatry

## 2019-04-23 ENCOUNTER — Other Ambulatory Visit: Payer: Self-pay

## 2019-04-23 ENCOUNTER — Encounter: Payer: Self-pay | Admitting: Podiatry

## 2019-04-23 VITALS — Temp 97.3°F

## 2019-04-23 DIAGNOSIS — M7662 Achilles tendinitis, left leg: Secondary | ICD-10-CM | POA: Diagnosis not present

## 2019-04-23 DIAGNOSIS — M7752 Other enthesopathy of left foot: Secondary | ICD-10-CM

## 2019-04-23 MED ORDER — METHYLPREDNISOLONE 4 MG PO TBPK: ORAL_TABLET | ORAL | 0 refills | Status: DC

## 2019-04-26 NOTE — Progress Notes (Signed)
   HPI: 53 year old male presenting today presenting today for follow up evaluation of insertional Achilles tendinitis of the left lower extremity. He reports some continued soreness. Bearing weight and ambulation increases the pain. He has been using the antiinflammatory pain cream for treatment. Patient is here for further evaluation and treatment.   Past Medical History:  Diagnosis Date  . Bicuspid aortic valve 01/24/2013  . Coronary artery disease   . Dyslipidemia   . H/O transesophageal echocardiography (TEE) for monitoring   . Hypertriglyceridemia   . IBS (irritable bowel syndrome)   . Sleep apnea       Physical Exam: General: The patient is alert and oriented x3 in no acute distress.  Dermatology: Skin is warm, dry and supple bilateral lower extremities. Negative for open lesions or macerations.  Vascular: Palpable pedal pulses bilaterally. No edema or erythema noted. Capillary refill within normal limits.  Neurological: Epicritic and protective threshold grossly intact bilaterally.   Musculoskeletal Exam: Pain on palpation noted to the posterior tubercle of the left calcaneus at the insertion of the Achilles tendon consistent with retrocalcaneal bursitis. Range of motion within normal limits. Muscle strength 5/5 in all muscle groups bilateral lower extremities.  MRI Impression:  1. Mild Achilles tendinosis with small intrasubstance tear at the insertion. Underlying reactive edema in the calcaneal tuberosity.  Assessment: 1. Insertional Achilles tendinitis left 2. Retrocalcaneal bursitis   Plan of Care:  1. Patient was evaluated. MRI reviewed today. 2. Injection of 0.5 mL Celestone Soluspan injected into the retrocalcaneal bursa. Care was taken to avoid direct injection into the Achilles tendon. 3. Prescription for Medrol Dose Pak provided to patient. 4. Ankle brace dispensed.  5. Patient cannot wear an immobilization cam boot to work.  He is going to continue wearing  his good supportive sneakers 6. Patient is on anticoagulant medication and cannot take oral NSAIDs. 7. Continue insoles from the [employer] store 8. Continue antiinflammatory cream from [facility].  9. Return to clinic in 6 weeks.    Edrick Kins, DPM [facility]  Dr. Edrick Kins, [address]                                        [place], [place] [address]                Office [phone]  Fax [phone]

## 2019-06-04 ENCOUNTER — Ambulatory Visit (INDEPENDENT_AMBULATORY_CARE_PROVIDER_SITE_OTHER): Payer: PRIVATE HEALTH INSURANCE | Admitting: Podiatry

## 2019-06-04 ENCOUNTER — Encounter: Payer: Self-pay | Admitting: Podiatry

## 2019-06-04 ENCOUNTER — Other Ambulatory Visit: Payer: Self-pay

## 2019-06-04 VITALS — Temp 97.3°F

## 2019-06-04 DIAGNOSIS — M7662 Achilles tendinitis, left leg: Secondary | ICD-10-CM | POA: Diagnosis not present

## 2019-06-04 DIAGNOSIS — W540XXA Bitten by dog, initial encounter: Secondary | ICD-10-CM | POA: Diagnosis not present

## 2019-06-04 DIAGNOSIS — M7752 Other enthesopathy of left foot: Secondary | ICD-10-CM

## 2019-06-04 DIAGNOSIS — S81851A Open bite, right lower leg, initial encounter: Secondary | ICD-10-CM | POA: Diagnosis not present

## 2019-06-06 NOTE — Progress Notes (Signed)
   HPI: 53 year old male presenting today for follow up evaluation of insertional Achilles tendinitis of the left lower extremity. He states the pain has improved but not resolved completely. He has been using the plantar fascial brace but states it is not providing any relief. 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 He was evaluated by his PCP and prescribed Bactrim DS which he is currently taking as directed. Patient is here for further evaluation and treatment.   Past Medical History:  Diagnosis Date  . Bicuspid aortic valve 01/24/2013  . Coronary artery disease   . Dyslipidemia   . H/O transesophageal echocardiography (TEE) for monitoring   . Hypertriglyceridemia   . IBS (irritable bowel syndrome)   . Sleep apnea       Physical Exam: General: The patient is alert and oriented x3 in no acute distress.  Dermatology: Dog bite noted to the right leg with intact fracture blisters. No drainage or malodor. Skin is warm, dry and supple bilateral lower extremities.   Vascular: Palpable pedal pulses bilaterally. No edema or erythema noted. Capillary refill within normal limits.  Neurological: Epicritic and protective threshold grossly intact bilaterally.   Musculoskeletal Exam: Pain on palpation noted to the posterior tubercle of the left calcaneus at the insertion of the Achilles tendon consistent with retrocalcaneal bursitis. Range of motion within normal limits. Muscle strength 5/5 in all muscle groups bilateral lower extremities.  MRI Impression:  1. Mild Achilles tendinosis with small intrasubstance tear at the insertion. Underlying reactive edema in the calcaneal tuberosity.  Assessment: 1. Insertional Achilles tendinitis left 2. Retrocalcaneal bursitis 3. Dog bite right leg   Plan of Care:  1. Patient was evaluated. MRI reviewed today. 2. Recommended EPAT with Janett Billow, RN.  3. Continue Bactrim DS as directed by PCP.  4. Patient is on  anticoagulant medication and cannot take oral NSAIDs. 5. Continue insoles from [employer].  6. Continue antiinflammatory cream from [facility].  7. Return to clinic as needed.    Edrick Kins, DPM [facility]  Dr. Edrick Kins, [address]                                        [place], [place] [address]                Office [phone]  Fax [phone]

## 2019-06-12 ENCOUNTER — Other Ambulatory Visit: Payer: Self-pay

## 2019-06-12 ENCOUNTER — Ambulatory Visit
Admission: RE | Admit: 2019-06-12 | Discharge: 2019-06-12 | Disposition: A | Discharge status: Home / Self Care | Payer: Worker's Compensation | Source: Ambulatory Visit | Attending: Family | Admitting: Family

## 2019-06-12 ENCOUNTER — Other Ambulatory Visit: Payer: Self-pay | Admitting: Family

## 2019-06-12 DIAGNOSIS — S8011XD Contusion of right lower leg, subsequent encounter: Secondary | ICD-10-CM

## 2019-11-23 ENCOUNTER — Ambulatory Visit: Payer: PRIVATE HEALTH INSURANCE | Attending: Internal Medicine

## 2019-11-23 DIAGNOSIS — Z20822 Contact with and (suspected) exposure to covid-19: Secondary | ICD-10-CM

## 2019-11-24 LAB — NOVEL CORONAVIRUS, NAA: SARS-CoV-2, NAA: NOT DETECTED

## 2020-05-02 ENCOUNTER — Other Ambulatory Visit: Payer: Self-pay

## 2020-05-02 ENCOUNTER — Ambulatory Visit (INDEPENDENT_AMBULATORY_CARE_PROVIDER_SITE_OTHER): Payer: PRIVATE HEALTH INSURANCE

## 2020-05-02 ENCOUNTER — Ambulatory Visit (INDEPENDENT_AMBULATORY_CARE_PROVIDER_SITE_OTHER): Payer: PRIVATE HEALTH INSURANCE | Admitting: Podiatry

## 2020-05-02 DIAGNOSIS — M65171 Other infective (teno)synovitis, right ankle and foot: Secondary | ICD-10-CM | POA: Diagnosis not present

## 2020-05-02 DIAGNOSIS — M779 Enthesopathy, unspecified: Secondary | ICD-10-CM

## 2020-05-02 DIAGNOSIS — M659 Synovitis and tenosynovitis, unspecified: Secondary | ICD-10-CM

## 2020-05-02 DIAGNOSIS — M19071 Primary osteoarthritis, right ankle and foot: Secondary | ICD-10-CM

## 2020-05-02 MED ORDER — METHYLPREDNISOLONE 4 MG PO TBPK: ORAL_TABLET | ORAL | 0 refills | Status: DC

## 2020-05-02 MED ORDER — METHYLPREDNISOLONE 4 MG PO TBPK
ORAL_TABLET | ORAL | 0 refills | Status: DC
Start: 2020-05-02 — End: 2022-05-03

## 2020-05-02 NOTE — Progress Notes (Signed)
° °  Subjective:  54 y.o. male presenting today for evaluation of a new complaint regarding pain to the right ankle joint.  Patient states that he has had pain to the right anterior ankle for the past 1-2 years now.  Patient was last seen in the office on 06/04/2019 for evaluation of Achilles tendinitis to the left lower extremity.  Patient denies injury to the right ankle.  He states that he gets random aches throughout the day.  He has been taking BC powder for the pain.   Past Medical History:  Diagnosis Date   Bicuspid aortic valve 01/24/2013   Coronary artery disease    Dyslipidemia    H/O transesophageal echocardiography (TEE) for monitoring    Hypertriglyceridemia    IBS (irritable bowel syndrome)    Sleep apnea      Objective / Physical Exam:  General:  The patient is alert and oriented x3 in no acute distress. Dermatology:  Skin is warm, dry and supple bilateral lower extremities. Negative for open lesions or macerations. Vascular:  Palpable pedal pulses bilaterally. No edema or erythema noted. Capillary refill within normal limits. Neurological:  Epicritic and protective threshold grossly intact bilaterally.  Musculoskeletal Exam:  Pain on palpation to the anterior lateral medial aspects of the patient's right ankle. Mild edema noted. Range of motion within normal limits to all pedal and ankle joints bilateral. Muscle strength 5/5 in all groups bilateral.   Radiographic Exam:  Normal osseous mineralization. There are some very mild joint space irregularity with narrowing throughout the pedal and ankle joints of the right foot. No fracture/dislocation/boney destruction.    Assessment: 1.  Synovitis of right ankle 2.  DJD right ankle  Plan of Care:  1. Patient was evaluated. X-Rays reviewed.  2. Injection of 0.5 mL Celestone Soluspan injected in the patient's right ankle. 3.  Prescription for Medrol Dosepak.  Patient is on anticoagulant Plavix and cannot take oral  NSAIDs 4.  Resume the ankle brace that was dispensed previously. 5.  Return to clinic in 4 weeks   Edrick Kins, DPM [facility]  Dr. Edrick Kins, [address]                                        [place], [place] [address]                Office [phone]  Fax [phone]

## 2020-05-02 NOTE — Addendum Note (Signed)
Addended by: Graceann Congress D on: 05/02/2020 05:42 PM   Modules accepted: Orders

## 2020-06-13 ENCOUNTER — Ambulatory Visit (INDEPENDENT_AMBULATORY_CARE_PROVIDER_SITE_OTHER): Payer: PRIVATE HEALTH INSURANCE | Admitting: Podiatry

## 2020-06-13 ENCOUNTER — Other Ambulatory Visit: Payer: Self-pay

## 2020-06-13 DIAGNOSIS — M19071 Primary osteoarthritis, right ankle and foot: Secondary | ICD-10-CM | POA: Diagnosis not present

## 2020-06-13 DIAGNOSIS — M659 Synovitis and tenosynovitis, unspecified: Secondary | ICD-10-CM

## 2020-06-13 NOTE — Progress Notes (Signed)
   HPI: 54 y.o. male presenting today for follow-up evaluation of right ankle pain this been going on approximately 1-2 years now.  He continues to have right ankle pain.  The anti-inflammatory steroidal injections only provided approximately a day of relief.  He also completed the Medrol Dosepak with minimal relief.  Patient is on anticoagulant Plavix and cannot take NSAIDs. He also has been wearing the ankle brace with minimal relief. He presents for further treatment and evaluation.   Past Medical History:  Diagnosis Date  . Bicuspid aortic valve 01/24/2013  . Coronary artery disease   . Dyslipidemia   . H/O transesophageal echocardiography (TEE) for monitoring   . Hypertriglyceridemia   . IBS (irritable bowel syndrome)   . Sleep apnea      Physical Exam: General: The patient is alert and oriented x3 in no acute distress.  Dermatology: Skin is warm, dry and supple bilateral lower extremities. Negative for open lesions or macerations.  Vascular: Palpable pedal pulses bilaterally. No edema or erythema noted. Capillary refill within normal limits.  Neurological: Epicritic and protective threshold grossly intact bilaterally.   Musculoskeletal Exam: Range of motion within normal limits to all pedal and ankle joints bilateral. Muscle strength 5/5 in all groups bilateral. Chronic pain on palpation to the anterior medial and lateral aspect of the right ankle joint. Pain with range of motion. There is also some minimal edema noted around the ankle joint  Assessment: 1.Synovitis right ankle.  2. DJD right ankle   Plan of Care:  1. Patient evaluated. 2. Today we discussed the conservative versus surgical management of the presenting pathology. The patient opts for surgical management. All possible complications and details of the procedure were explained. All patient questions were answered. No guarantees were expressed or implied. 3. Authorization for surgery was initiated today. Surgery  will consist of ankle arthroscopy with debridement right.  4. Patient recently had cardiac stents placed due to CAD. He will require cardiac clearance prior to surgery.  5. Return to clinic one week        Edrick Kins, DPM [facility]  Dr. Edrick Kins, DPM    [address], [place] [address]                Office [phone]  Fax [phone]

## 2020-06-19 ENCOUNTER — Encounter: Payer: Self-pay | Admitting: *Deleted

## 2020-06-21 ENCOUNTER — Ambulatory Visit: Admit: 2020-06-21 | Payer: PRIVATE HEALTH INSURANCE | Admitting: Internal Medicine

## 2020-06-21 SURGERY — COLONOSCOPY WITH PROPOFOL
Anesthesia: General

## 2020-07-13 ENCOUNTER — Other Ambulatory Visit: Payer: Self-pay | Admitting: Podiatry

## 2020-07-13 DIAGNOSIS — M19071 Primary osteoarthritis, right ankle and foot: Secondary | ICD-10-CM

## 2020-07-27 ENCOUNTER — Telehealth: Payer: Self-pay

## 2020-07-27 NOTE — Telephone Encounter (Signed)
DOS 08/17/2020  ANKLE ARTHROSCOPY RT - [id number]  [employer] EFFECTIVE DATE - 02/26/2019  PLAN DEDUCTIBLE - $500.00 W/ $0.00 REMAINING OUT OF POCKET - $3500.00 W/ $0.00 REMAINING COPAY $0.00 COINSURANCE - 80% AFTER DEDUCTIBLE  PER [facility] AT [employer], NO PRECERT REQUIRED FOR PROCEDURES DONE AT AN ASC  CALL REF # [id number]

## 2020-08-14 ENCOUNTER — Telehealth: Payer: Self-pay

## 2020-08-14 NOTE — Telephone Encounter (Signed)
Robert Haley is scheduled for foot surgery with Dr. Amalia Hailey on 08/17/2020. We received information from Martha's cardiologist, Dr. Ronnald Ramp w/[facility] stating Aldo had an PCI 03/2020 and he is unable to stop Plavix/ASA for 6 months. I discussed this with Dr. Amalia Hailey and Dr. Amalia Hailey would like to hold off on the surgery till Jan. 2022. I spoke to the patient and he understands. Aivan requested to call me back to get his surgery rescheduled.

## 2020-08-24 ENCOUNTER — Telehealth: Payer: Self-pay

## 2020-08-24 NOTE — Telephone Encounter (Signed)
Delora from [facility] called and stated Dr. Ronnald Ramp reviewed [name] chart and he needs to wait 12 months from PCI to have surgery. His PCI was done 03/2020. I informed Steven of this information.

## 2020-08-25 ENCOUNTER — Encounter: Payer: PRIVATE HEALTH INSURANCE | Admitting: Podiatry

## 2020-09-01 ENCOUNTER — Encounter: Payer: PRIVATE HEALTH INSURANCE | Admitting: Podiatry

## 2020-09-15 ENCOUNTER — Encounter: Payer: PRIVATE HEALTH INSURANCE | Admitting: Podiatry

## 2021-04-30 IMAGING — MR MRI OF THE LEFT ANKLE WITHOUT CONTRAST
5 series · 40 of 40 positions shown · non-contrast
Comparison: Left foot x-rays dated December 29, 2018.

CLINICAL DATA: Chronic posterior ankle pain in the region of the
Achilles tendon.

EXAM:
MRI OF THE LEFT ANKLE WITHOUT CONTRAST
TECHNIQUE: Multiplanar, multisequence MR imaging of the ankle was performed. No
intravenous contrast was administered.

[Series 4: T2 fat-sat · axial · left · 3.0mm · 0.50mm/px · z∈[-90,+71]mm · 10 of 50 slices shown (1 of 2)]
[im 1/50]
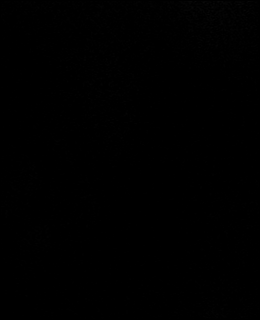
[im 6/50]
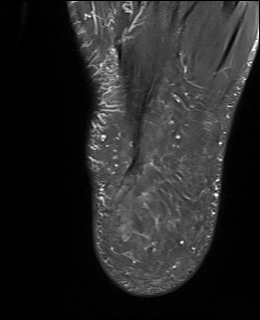
[im 11/50]
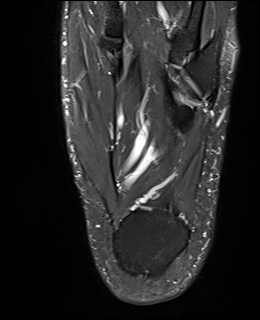
[im 17/50]
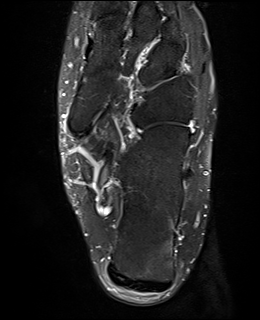
[im 22/50]
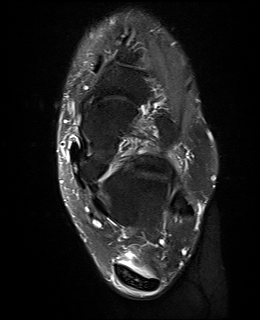
[im 28/50]
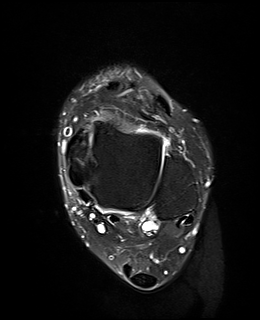
[im 33/50]
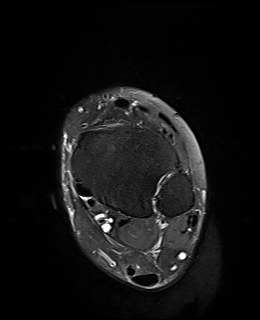
[im 39/50]
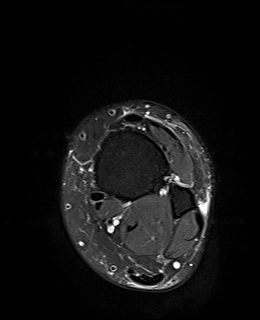
[im 44/50]
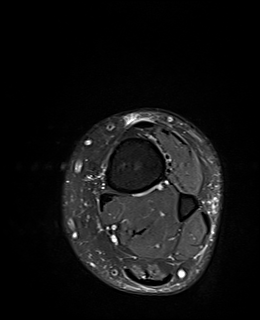
[im 50/50]
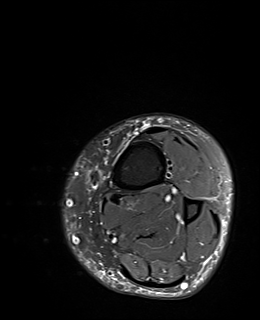

[Series 5: T1 · axial · left · 3.0mm · 0.67mm/px · z∈[-89,+72]mm · 10 of 50 slices shown (1 of 2)]
[im 1/50]
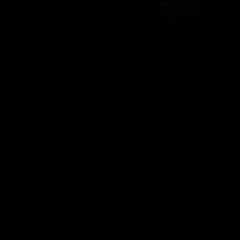
[im 6/50]
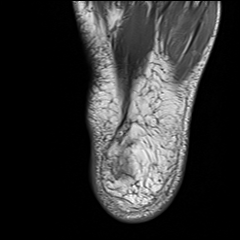
[im 11/50]
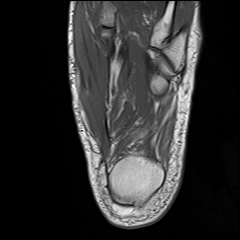
[im 17/50]
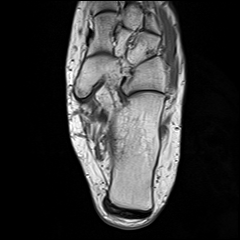
[im 22/50]
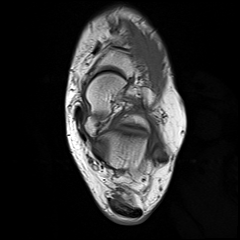
[im 28/50]
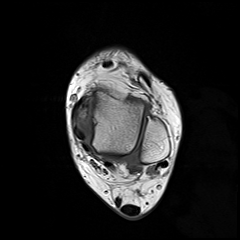
[im 33/50]
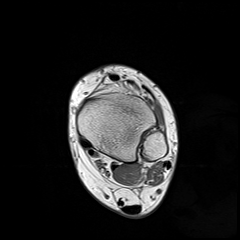
[im 39/50]
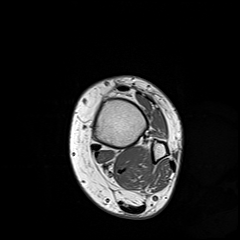
[im 44/50]
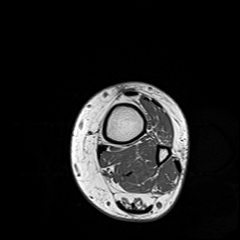
[im 50/50]
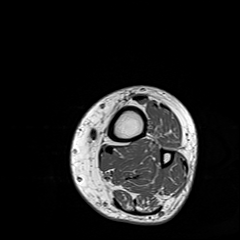

[Series 6: T2 fat-sat · coronal · left · 3.0mm · 0.36mm/px · 8 of 40 slices shown (2 of 2)]
[im 1/40]
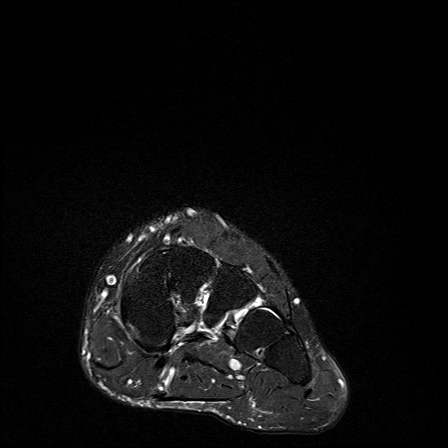
[im 6/40]
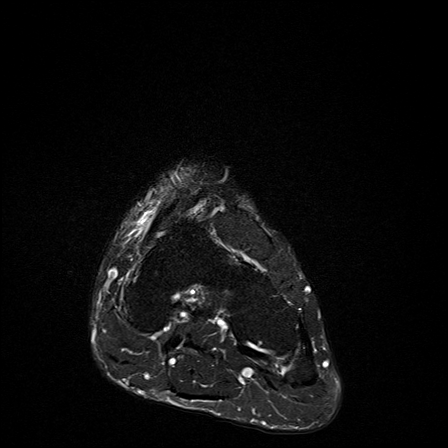
[im 12/40]
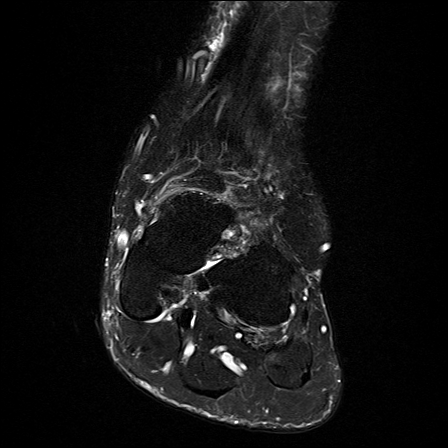
[im 17/40]
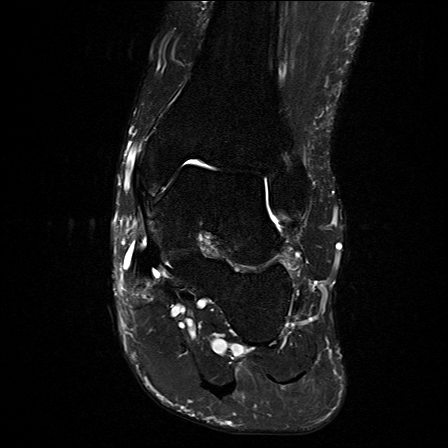
[im 23/40]
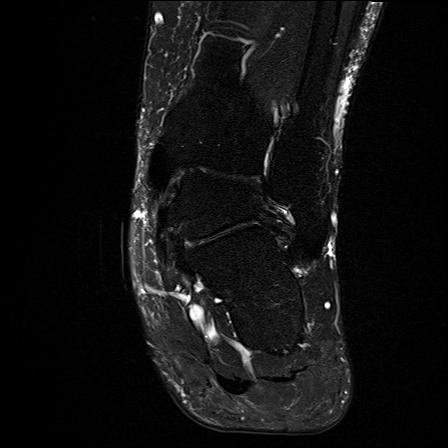
[im 28/40]
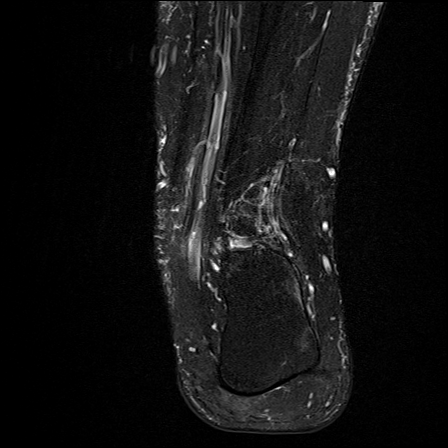
[im 34/40]
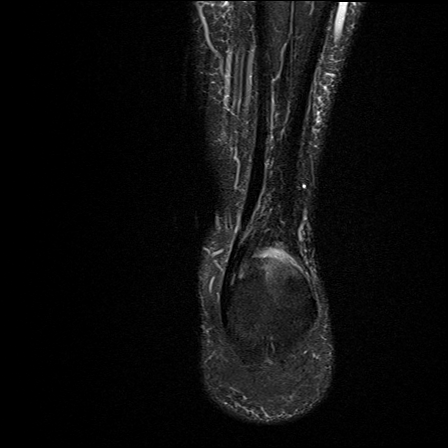
[im 40/40]
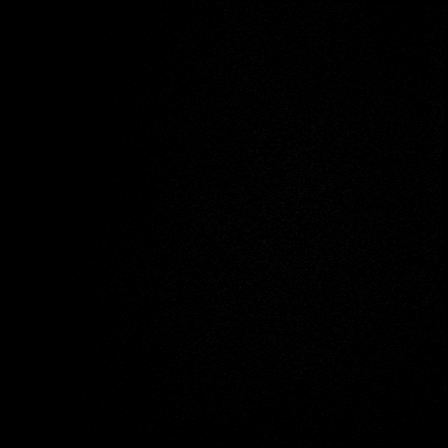

[Series 7: T1 · sagittal · left · 3.0mm · 0.42mm/px · 6 of 32 slices shown (2 of 2)]
[im 1/32]
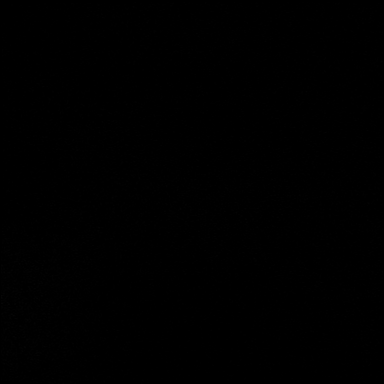
[im 7/32]
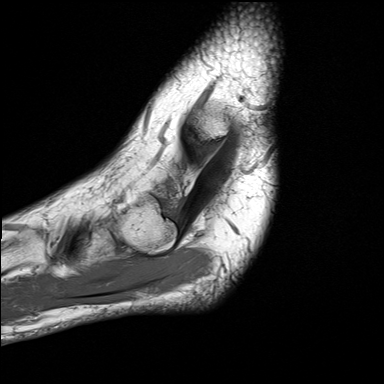
[im 13/32]
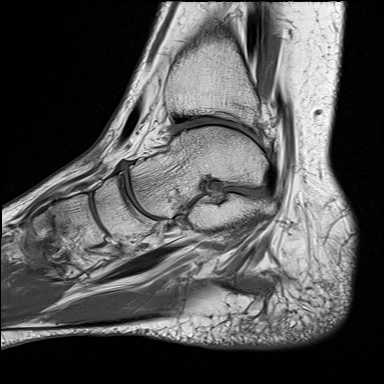
[im 19/32]
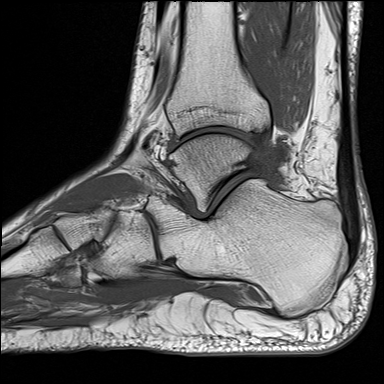
[im 25/32]
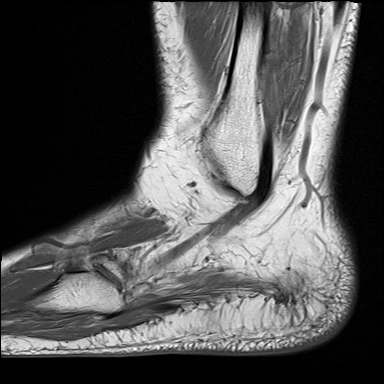
[im 32/32]
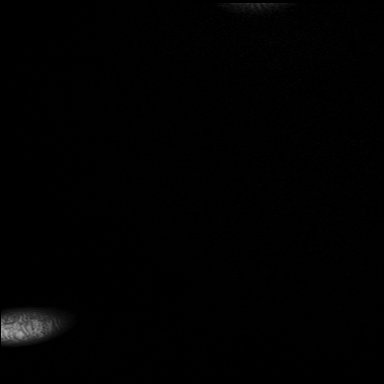

[Series 8: STIR · sagittal · left · 3.0mm · 0.56mm/px · 6 of 31 slices shown]
[im 1/31]
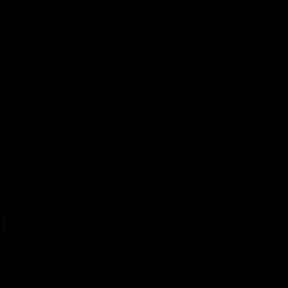
[im 7/31]
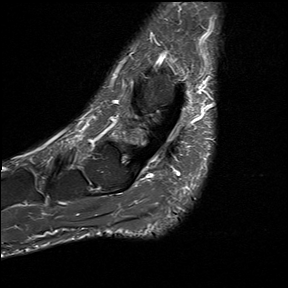
[im 13/31]
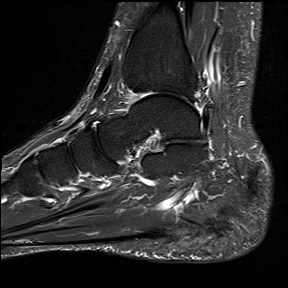
[im 19/31]
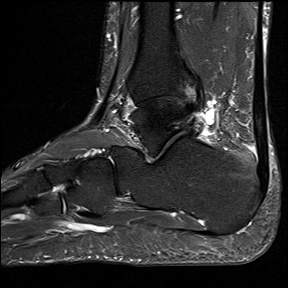
[im 25/31]
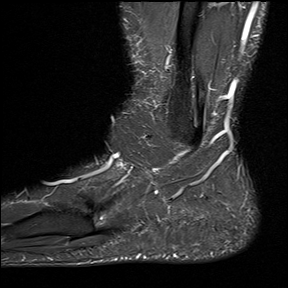
[im 31/31]
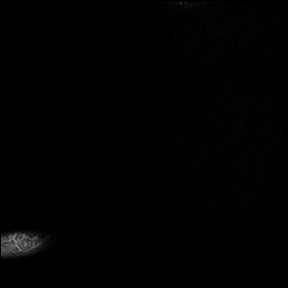

[40 of 40 positions shown; findings below may reference images not displayed]

FINDINGS: TENDONS

Peroneal: Peroneal longus tendon intact. Peroneal brevis intact.

Posteromedial: Posterior tibial tendon intact. Flexor hallucis
longus tendon intact. Flexor digitorum longus tendon intact.

Anterior: Tibialis anterior tendon intact. Extensor hallucis longus
tendon intact Extensor digitorum longus tendon intact.

Achilles: Mild tendinosis of the mid to distal tendon with small
intrasubstance tear along the medial aspect of the insertion. Small
amount of fluid in the retrocalcaneal bursa.

Plantar Fascia: Intact.

LIGAMENTS

Lateral: Anterior talofibular ligament intact but mildly irregular,
likely reflecting old injury. Calcaneofibular ligament intact.
Posterior talofibular ligament intact with adjacent 1.1 x 1.0 x
cm ganglion cyst. Anterior and posterior tibiofibular ligaments
intact.

Medial: Deltoid ligament intact. Spring ligament intact.

CARTILAGE

Ankle Joint: No joint effusion. Normal ankle mortise. No chondral
defect.

Subtalar Joints/Sinus Tarsi: Normal subtalar joints. No subtalar
joint effusion. Normal sinus tarsi.

Bones: Reactive edema in the calcaneal tuberosity at the Achilles
insertion. No fracture or dislocation. Os trigonum.

Soft Tissue: No soft tissue mass or fluid collection.
IMPRESSION: 1. Mild Achilles tendinosis with small intrasubstance tear at the
insertion. Underlying reactive edema in the calcaneal tuberosity.

## 2022-05-03 ENCOUNTER — Encounter: Payer: Self-pay | Admitting: Podiatry

## 2022-05-03 ENCOUNTER — Ambulatory Visit (INDEPENDENT_AMBULATORY_CARE_PROVIDER_SITE_OTHER): Payer: PRIVATE HEALTH INSURANCE

## 2022-05-03 ENCOUNTER — Ambulatory Visit: Payer: PRIVATE HEALTH INSURANCE | Admitting: Podiatry

## 2022-05-03 DIAGNOSIS — M659 Synovitis and tenosynovitis, unspecified: Secondary | ICD-10-CM

## 2022-05-03 MED ORDER — BETAMETHASONE SOD PHOS & ACET 6 (3-3) MG/ML IJ SUSP
3.0000 mg | Freq: Once | INTRAMUSCULAR | Status: AC
  Administered 2022-05-03: 3 mg via INTRA_ARTICULAR

## 2022-05-03 MED ORDER — METHYLPREDNISOLONE 4 MG PO TBPK: ORAL_TABLET | ORAL | 0 refills | Status: DC

## 2022-05-03 NOTE — Progress Notes (Signed)
   HPI: 56 y.o. male presenting today for follow-up evaluation of right ankle pain this been going on for quite a few years now.  He was last seen in the office on 06/13/2020.  He continues to have chronic right ankle pain.  In 2021 we did discuss possible surgery which would include an ankle arthroscopy.  Patient states that he was not in a position to pursue surgery at that time.  He states that he is leaving tomorrow for a beach trip and would like to return in a few weeks to discuss his surgical options to help alleviate some of the pain.  He presents for further treatment and evaluation  Past Medical History:  Diagnosis Date   Bicuspid aortic valve 01/24/2013   Coronary artery disease    Dyslipidemia    H/O transesophageal echocardiography (TEE) for monitoring    Hypertriglyceridemia    IBS (irritable bowel syndrome)    Sleep apnea    Past Surgical History:  Procedure Laterality Date   CHOLECYSTECTOMY     COLONOSCOPY WITH PROPOFOL N/A 03/20/2016   Procedure: COLONOSCOPY WITH PROPOFOL;  Surgeon: Manya Silvas, MD;  Location: [facility];  Service: Endoscopy;  Laterality: N/A;   CORONARY ARTERY BYPASS GRAFT     HERNIA REPAIR     NASAL SEPTUM SURGERY     TONSILLECTOMY     Allergies  Allergen Reactions   Definity [Perflutren Lipid Microsphere]    Morphine And Related       Physical Exam: General: The patient is alert and oriented x3 in no acute distress.  Dermatology: Skin is warm, dry and supple bilateral lower extremities. Negative for open lesions or macerations.  Vascular: Palpable pedal pulses bilaterally. No edema or erythema noted. Capillary refill within normal limits.  Neurological: Epicritic and protective threshold grossly intact bilaterally.   Musculoskeletal Exam: Range of motion within normal limits to all pedal and ankle joints bilateral. Muscle strength 5/5 in all groups bilateral. Chronic pain on palpation to the anterior medial and lateral aspect of the  right ankle joint. Pain with range of motion. There is also some minimal edema noted around the ankle joint  Radiographic exam RT ankle 05/03/2022: Advanced degenerative erosive changes noted to the tibiotalar joint of the right ankle.  Consistent with chronic DJD.  No acute fractures identified.  Assessment: 1.Synovitis right ankle.  2. DJD right ankle   Plan of Care:  1. Patient evaluated. 2.  Injection of 0.5 cc Celestone Soluspan injected into the medial aspect of the right ankle joint 3.  Prescription for Medrol Dosepak 4.  Patient is on anticoagulant Plavix and cannot take NSAIDs 5.  Continue wearing the ankle brace 6.  Return to clinic 2 weeks after his trip.  At this time we may need to discuss further treatment options and readdress surgery  *Going on a beach trip tomorrow, 05/04/2022     Edrick Kins, DPM [facility]  Dr. Edrick Kins, DPM    [address], [place] [address]                Office [phone]  Fax [phone]

## 2022-05-14 ENCOUNTER — Ambulatory Visit (INDEPENDENT_AMBULATORY_CARE_PROVIDER_SITE_OTHER): Payer: PRIVATE HEALTH INSURANCE | Admitting: Podiatry

## 2022-05-14 ENCOUNTER — Ambulatory Visit (INDEPENDENT_AMBULATORY_CARE_PROVIDER_SITE_OTHER): Payer: PRIVATE HEALTH INSURANCE

## 2022-05-14 DIAGNOSIS — M19071 Primary osteoarthritis, right ankle and foot: Secondary | ICD-10-CM

## 2022-05-14 DIAGNOSIS — M659 Synovitis and tenosynovitis, unspecified: Secondary | ICD-10-CM

## 2022-05-14 NOTE — Progress Notes (Signed)
   HPI: 56 y.o. male presenting today for follow-up evaluation of right ankle pain this been going on for quite a few years now.  Last visit injection was administered as well as Medrol Dosepak and the patient states that he had no relief of symptoms.  He continues to have pain and tenderness to the right ankle.  Past Medical History:  Diagnosis Date   Bicuspid aortic valve 01/24/2013   Coronary artery disease    Dyslipidemia    H/O transesophageal echocardiography (TEE) for monitoring    Hypertriglyceridemia    IBS (irritable bowel syndrome)    Sleep apnea    Past Surgical History:  Procedure Laterality Date   CHOLECYSTECTOMY     COLONOSCOPY WITH PROPOFOL N/A 03/20/2016   Procedure: COLONOSCOPY WITH PROPOFOL;  Surgeon: Manya Silvas, MD;  Location: [facility] ENDOSCOPY;  Service: Endoscopy;  Laterality: N/A;   CORONARY ARTERY BYPASS GRAFT     HERNIA REPAIR     NASAL SEPTUM SURGERY     TONSILLECTOMY     Allergies  Allergen Reactions   Definity [Perflutren Lipid Microsphere]    Morphine And Related       Physical Exam: General: The patient is alert and oriented x3 in no acute distress.  Dermatology: Skin is warm, dry and supple bilateral lower extremities. Negative for open lesions or macerations.  Vascular: Palpable pedal pulses bilaterally. No edema or erythema noted. Capillary refill within normal limits.  Neurological: Epicritic and protective threshold grossly intact bilaterally.   Musculoskeletal Exam: Range of motion within normal limits to all pedal and ankle joints bilateral. Muscle strength 5/5 in all groups bilateral.  There continues to be chronic pain on palpation to the anterior medial and lateral aspect of the right ankle joint. Pain with range of motion. There is also some minimal edema noted around the ankle joint  Radiographic exam RT ankle 05/03/2022: Advanced degenerative erosive changes noted to the tibiotalar joint of the right ankle.  Consistent with chronic  DJD.  No acute fractures identified.  Assessment: 1.Synovitis right ankle.  2. DJD right ankle   Plan of Care:  1. Patient evaluated. 2.  MRI ordered RT ankle.  Patient continues to have pain despite conservative treatments 4.  Patient is on anticoagulant Plavix and cannot take NSAIDs 5.  Continue wearing the ankle brace.  New ankle brace was dispensed today 6.  Will call patient to review the MRI results and discuss further treatment options  *Going on a beach trip tomorrow, 05/04/2022     Edrick Kins, DPM [facility]  Dr. Edrick Kins, DPM    [address],  [address]                Office [phone]  Fax [phone]

## 2022-05-14 NOTE — Progress Notes (Signed)
Dg  

## 2022-05-24 ENCOUNTER — Telehealth: Payer: Self-pay | Admitting: *Deleted

## 2022-05-24 NOTE — Telephone Encounter (Signed)
I called to see if authorization is needed for the MRI of Ankle without contrast.  I spoke to [employer].  She said authorization is not needed if the MRI is done in an outpatient setting. The call reference number according to Adonis Brook is her name and today's date, [email or web address].  I attempted to call Mr. Corsino in regards to his needed MRI.  I left him a message to call [place] to schedule an appointment for the MRI at [phone].  I asked him to call us on Monday if he has any questions.

## 2022-07-10 ENCOUNTER — Ambulatory Visit
Admission: RE | Admit: 2022-07-10 | Discharge: 2022-07-10 | Disposition: A | Discharge status: Home / Self Care | Payer: PRIVATE HEALTH INSURANCE | Source: Ambulatory Visit | Attending: Podiatry | Admitting: Podiatry

## 2022-07-10 ENCOUNTER — Other Ambulatory Visit: Payer: Self-pay | Admitting: Podiatry

## 2022-07-10 DIAGNOSIS — M19071 Primary osteoarthritis, right ankle and foot: Secondary | ICD-10-CM

## 2022-07-10 DIAGNOSIS — M659 Synovitis and tenosynovitis, unspecified: Secondary | ICD-10-CM | POA: Insufficient documentation

## 2022-07-10 NOTE — Progress Notes (Signed)
RT ankle

## 2022-07-23 ENCOUNTER — Telehealth: Payer: Self-pay | Admitting: *Deleted

## 2022-07-23 NOTE — Telephone Encounter (Signed)
Spoke with patient on the phone.  MRI was reviewed over the phone.  He will return to the clinic as needed.  In the meantime he is taking Tylenol with some slight relief.  Thanks, Dr. Amalia Hailey

## 2022-07-23 NOTE — Progress Notes (Signed)
Attempted to call patient to discuss MRI results.  No answer.  Left message to reach out to the office or to come in for follow-up to discuss MRI results. -Dr. Amalia Hailey

## 2022-07-23 NOTE — Telephone Encounter (Signed)
Patient is calling for MRI results,please advise 

## 2022-10-24 NOTE — H&P (Signed)
Pre-Procedure H&P   Patient ID: Robert Haley is a 56 y.o. male.  Gastroenterology Provider: Annamaria Helling, DO  Referring Provider: Laurine Blazer, PA PCP: Maryland Pink, MD  Date: 10/25/2022  HPI [name] is a 56 y.o. male who presents today for Colonoscopy for Surveillance-personal history of colon polyps .  Patient with a personal history of colon polyps.  Last underwent colonoscopy and 2017 demonstrating 1 tubular adenoma and 18 hyperplastic polyps.  Currently has 2-3 bowel movements per day without melena or hematochezia  He received callback for colonoscopy in 2021, however, he underwent cardiac catheterization with PCI and DES placement and was placed on DAPT with instructions to continue without interruption for 1 year.  He is still currently on Plavix was been held 5 days for this procedure (last dose Saturday- 23)  He is on Ozempic and his last dose was December 22  Creatinine 0.9 hemoglobin 15.1 MCV 91 platelets 194,000  Status postcholecystectomy and CABG.  Cardiology has evaluated and "cleared for surgery" and placed him at intermediate risk for procedure. S/p TAVR  No family history colon cancer or colon polyps   Past Medical History:  Diagnosis Date   Bicuspid aortic valve 01/24/2013   Coronary artery disease    Dyslipidemia    H/O transesophageal echocardiography (TEE) for monitoring    Hypertriglyceridemia    IBS (irritable bowel syndrome)    Sleep apnea     Past Surgical History:  Procedure Laterality Date   CHOLECYSTECTOMY     COLONOSCOPY WITH PROPOFOL N/A 03/20/2016   Procedure: COLONOSCOPY WITH PROPOFOL;  Surgeon: Manya Silvas, MD;  Location: [place];  Service: Endoscopy;  Laterality: N/A;   CORONARY ARTERY BYPASS GRAFT     HERNIA REPAIR     NASAL SEPTUM SURGERY     TONSILLECTOMY      Family History No h/o GI disease or malignancy  Review of Systems  Constitutional:  Negative for activity change, appetite change,  chills, diaphoresis, fatigue, fever and unexpected weight change.  HENT:  Negative for trouble swallowing and voice change.   Respiratory:  Negative for shortness of breath and wheezing.   Cardiovascular:  Negative for chest pain, palpitations and leg swelling.  Gastrointestinal:  Negative for abdominal distention, abdominal pain, anal bleeding, blood in stool, constipation, diarrhea, nausea and vomiting.  Musculoskeletal:  Negative for arthralgias and myalgias.  Skin:  Negative for color change and pallor.  Neurological:  Negative for dizziness, syncope and weakness.  Psychiatric/Behavioral:  Negative for confusion. The patient is not nervous/anxious.   All other systems reviewed and are negative.    Medications No current facility-administered medications on file prior to encounter.   Current Outpatient Medications on File Prior to Encounter  Medication Sig Dispense Refill   metoprolol (LOPRESSOR) 50 MG tablet Take 50 mg by mouth 2 (two) times daily.     aspirin 81 MG tablet Take 81 mg by mouth daily.     atorvastatin (LIPITOR) 80 MG tablet Take 80 mg by mouth daily.     buPROPion (WELLBUTRIN SR) 150 MG 12 hr tablet Take 150 mg by mouth 2 (two) times daily.     buPROPion (WELLBUTRIN XL) 150 MG 24 hr tablet      clopidogrel (PLAVIX) 75 MG tablet Take 75 mg by mouth daily.     methylPREDNISolone (MEDROL DOSEPAK) 4 MG TBPK tablet 6 day dose pack - take as directed 21 tablet 0   Multiple Vitamin (MULTIVITAMIN) tablet Take 1 tablet  by mouth daily.     nitroGLYCERIN (NITROSTAT) 0.4 MG SL tablet Place under the tongue.     NONFORMULARY OR COMPOUNDED ITEM See pharmacy note 120 each 2   OMEGA-3 FATTY ACIDS PO Take 1,000 mg by mouth daily.     sertraline (ZOLOFT) 100 MG tablet Take 100 mg by mouth daily.     vitamin B-12 (CYANOCOBALAMIN) 1000 MCG tablet Take by mouth.     vitamin C (ASCORBIC ACID) 500 MG tablet Take 500 mg by mouth daily.     zinc gluconate 50 MG tablet Take 50 mg by mouth  daily.      Pertinent medications related to GI and procedure were reviewed by me with the patient prior to the procedure   Current Facility-Administered Medications:    0.9 %  sodium chloride infusion, , Intravenous, Continuous, Annamaria Helling, DO      Allergies  Allergen Reactions   Definity [Perflutren Lipid Microsphere]    Morphine And Related    Allergies were reviewed by me prior to the procedure  Objective   Body mass index is 47.26 kg/m. Vitals:   10/25/22 0708  BP: (!) 163/94  Pulse: 87  Resp: 20  Temp: (!) 97.5 F (36.4 C)  TempSrc: Temporal  SpO2: 95%  Weight: (!) 145.2 kg  Height: '5\' 9"'$  (1.753 m)     Physical Exam Vitals and nursing note reviewed.  Constitutional:      General: He is not in acute distress.    Appearance: Normal appearance. He is obese. He is not ill-appearing, toxic-appearing or diaphoretic.  HENT:     Head: Normocephalic and atraumatic.     Nose: Nose normal.     Mouth/Throat:     Mouth: Mucous membranes are moist.     Pharynx: Oropharynx is clear.  Eyes:     General: No scleral icterus.    Extraocular Movements: Extraocular movements intact.  Cardiovascular:     Rate and Rhythm: Normal rate and regular rhythm.     Heart sounds: Normal heart sounds. No murmur heard.    No friction rub. No gallop.  Pulmonary:     Effort: Pulmonary effort is normal. No respiratory distress.     Breath sounds: Normal breath sounds. No wheezing, rhonchi or rales.  Abdominal:     General: Bowel sounds are normal. There is no distension.     Palpations: Abdomen is soft.     Tenderness: There is no abdominal tenderness. There is no guarding or rebound.  Musculoskeletal:     Cervical back: Neck supple.     Right lower leg: No edema.     Left lower leg: No edema.  Skin:    General: Skin is warm and dry.     Coloration: Skin is not jaundiced or pale.  Neurological:     General: No focal deficit present.     Mental Status: He is alert and  oriented to person, place, and time. Mental status is at baseline.  Psychiatric:        Mood and Affect: Mood normal.        Behavior: Behavior normal.        Thought Content: Thought content normal.        Judgment: Judgment normal.      Assessment:  [name] is a 56 y.o. male  who presents today for Colonoscopy for Surveillance-personal history of colon polyps .  Plan:  Colonoscopy with possible intervention today  Colonoscopy with possible biopsy,  control of bleeding, polypectomy, and interventions as necessary has been discussed with the patient/patient representative. Informed consent was obtained from the patient/patient representative after explaining the indication, nature, and risks of the procedure including but not limited to death, bleeding, perforation, missed neoplasm/lesions, cardiorespiratory compromise, and reaction to medications. Opportunity for questions was given and appropriate answers were provided. Patient/patient representative has verbalized understanding is amenable to undergoing the procedure.   Annamaria Helling, DO  [facility]  Portions of the record may have been created with voice recognition software. Occasional wrong-word or 'sound-a-like' substitutions may have occurred due to the inherent limitations of voice recognition software.  Read the chart carefully and recognize, using context, where substitutions may have occurred.

## 2022-10-25 ENCOUNTER — Ambulatory Visit: Payer: PRIVATE HEALTH INSURANCE | Admitting: Certified Registered"

## 2022-10-25 ENCOUNTER — Ambulatory Visit
Admission: RE | Admit: 2022-10-25 | Discharge: 2022-10-25 | Disposition: A | Discharge status: Home / Self Care | Payer: PRIVATE HEALTH INSURANCE | Attending: Gastroenterology | Admitting: Gastroenterology

## 2022-10-25 ENCOUNTER — Encounter: Payer: Self-pay | Admitting: Gastroenterology

## 2022-10-25 ENCOUNTER — Encounter
Admission: RE | Disposition: A | Discharge status: Home / Self Care | Payer: Self-pay | Source: Home / Self Care | Attending: Gastroenterology

## 2022-10-25 DIAGNOSIS — D123 Benign neoplasm of transverse colon: Secondary | ICD-10-CM | POA: Insufficient documentation

## 2022-10-25 DIAGNOSIS — G4730 Sleep apnea, unspecified: Secondary | ICD-10-CM | POA: Diagnosis not present

## 2022-10-25 DIAGNOSIS — Z955 Presence of coronary angioplasty implant and graft: Secondary | ICD-10-CM | POA: Diagnosis not present

## 2022-10-25 DIAGNOSIS — Z86010 Personal history of colonic polyps: Secondary | ICD-10-CM | POA: Diagnosis not present

## 2022-10-25 DIAGNOSIS — Z1211 Encounter for screening for malignant neoplasm of colon: Secondary | ICD-10-CM | POA: Insufficient documentation

## 2022-10-25 DIAGNOSIS — Z7982 Long term (current) use of aspirin: Secondary | ICD-10-CM | POA: Diagnosis not present

## 2022-10-25 DIAGNOSIS — K644 Residual hemorrhoidal skin tags: Secondary | ICD-10-CM | POA: Diagnosis not present

## 2022-10-25 DIAGNOSIS — K640 First degree hemorrhoids: Secondary | ICD-10-CM | POA: Diagnosis not present

## 2022-10-25 DIAGNOSIS — I10 Essential (primary) hypertension: Secondary | ICD-10-CM | POA: Insufficient documentation

## 2022-10-25 DIAGNOSIS — Z7902 Long term (current) use of antithrombotics/antiplatelets: Secondary | ICD-10-CM | POA: Diagnosis not present

## 2022-10-25 DIAGNOSIS — E781 Pure hyperglyceridemia: Secondary | ICD-10-CM | POA: Insufficient documentation

## 2022-10-25 DIAGNOSIS — Z87891 Personal history of nicotine dependence: Secondary | ICD-10-CM | POA: Diagnosis not present

## 2022-10-25 DIAGNOSIS — D125 Benign neoplasm of sigmoid colon: Secondary | ICD-10-CM | POA: Diagnosis not present

## 2022-10-25 DIAGNOSIS — Z952 Presence of prosthetic heart valve: Secondary | ICD-10-CM | POA: Insufficient documentation

## 2022-10-25 DIAGNOSIS — Z6841 Body Mass Index (BMI) 40.0 and over, adult: Secondary | ICD-10-CM | POA: Diagnosis not present

## 2022-10-25 DIAGNOSIS — K5730 Diverticulosis of large intestine without perforation or abscess without bleeding: Secondary | ICD-10-CM | POA: Diagnosis not present

## 2022-10-25 DIAGNOSIS — D124 Benign neoplasm of descending colon: Secondary | ICD-10-CM | POA: Diagnosis not present

## 2022-10-25 DIAGNOSIS — I2510 Atherosclerotic heart disease of native coronary artery without angina pectoris: Secondary | ICD-10-CM | POA: Diagnosis not present

## 2022-10-25 HISTORY — PX: COLONOSCOPY WITH PROPOFOL: SHX5780

## 2022-10-25 SURGERY — COLONOSCOPY WITH PROPOFOL
Anesthesia: General

## 2022-10-25 MED ORDER — SODIUM CHLORIDE 0.9 % IV SOLN: INTRAVENOUS | Status: DC

## 2022-10-25 MED ORDER — PROPOFOL 10 MG/ML IV BOLUS
INTRAVENOUS | Status: DC | PRN
  Administered 2022-10-25 (×3): 50 mg via INTRAVENOUS
  Administered 2022-10-25: 200 mg via INTRAVENOUS

## 2022-10-25 MED ORDER — PROPOFOL 500 MG/50ML IV EMUL
INTRAVENOUS | Status: DC | PRN
  Administered 2022-10-25: 80 ug/kg/min via INTRAVENOUS

## 2022-10-25 MED ORDER — PROPOFOL 1000 MG/100ML IV EMUL
INTRAVENOUS | Status: AC
  Filled 2022-10-25: qty 200

## 2022-10-25 MED ORDER — LIDOCAINE HCL (CARDIAC) PF 100 MG/5ML IV SOSY
PREFILLED_SYRINGE | INTRAVENOUS | Status: DC | PRN
  Administered 2022-10-25: 100 mg via INTRAVENOUS
  Administered 2022-10-25: 150 mg via INTRAVENOUS

## 2022-10-25 NOTE — Interval H&P Note (Signed)
History and Physical Interval Note: Preprocedure H&P from 10/25/22  was reviewed and there was no interval change after seeing and examining the patient.  Written consent was obtained from the patient after discussion of risks, benefits, and alternatives. Patient has consented to proceed with Colonoscopy with possible intervention   10/25/2022 7:33 AM  Robert Haley  has presented today for surgery, with the diagnosis of H/O Colon Polyps.  The various methods of treatment have been discussed with the patient and family. After consideration of risks, benefits and other options for treatment, the patient has consented to  Procedure(s): COLONOSCOPY WITH PROPOFOL (N/A) as a surgical intervention.  The patient's history has been reviewed, patient examined, no change in status, stable for surgery.  I have reviewed the patient's chart and labs.  Questions were answered to the patient's satisfaction.     Annamaria Helling

## 2022-10-25 NOTE — Anesthesia Postprocedure Evaluation (Signed)
Anesthesia Post Note  Patient: Robert Haley  Procedure(s) Performed: COLONOSCOPY WITH PROPOFOL  Patient location during evaluation: PACU Anesthesia Type: General Level of consciousness: awake and awake and alert Pain management: pain level controlled Vital Signs Assessment: post-procedure vital signs reviewed and stable Respiratory status: spontaneous breathing and nonlabored ventilation Cardiovascular status: blood pressure returned to baseline Anesthetic complications: no  No notable events documented.   Last Vitals:  Vitals:   10/25/22 0818 10/25/22 0831  BP: 114/68 128/87  Pulse: 95 90  Resp: 18 19  Temp: (!) 36 C   SpO2: 95% 96%    Last Pain:  Vitals:   10/25/22 0831  TempSrc:   PainSc: 0-No pain                 VAN STAVEREN,Stefhanie Kachmar

## 2022-10-25 NOTE — Op Note (Signed)
[facility] Patient Name: Robert Haley Procedure Date: 10/25/2022 7:13 AM MRN: [id number] Account #: [id number] Date of Birth: 1966/09/20 Admit Type: Outpatient Age: 56 Room: [facility] ENDO ROOM 1 Gender: Male Note Status: [place] Instrument Name: Colonoscope [id number] Procedure:             Colonoscopy Indications:           High risk colon cancer surveillance: Personal history                         of colonic polyps Providers:             Rueben Bash, DO Referring MD:          Irven Easterly. Kary Kos, MD (Referring MD) Medicines:             Monitored Anesthesia Care Complications:         No immediate complications. Estimated blood loss:                         Minimal. Procedure:             Pre-Anesthesia Assessment:                        - Prior to the procedure, a History and Physical was                         performed, and patient medications and allergies were                         reviewed. The patient is competent. The risks and                         benefits of the procedure and the sedation options and                         risks were discussed with the patient. All questions                         were answered and informed consent was obtained.                         Patient identification and proposed procedure were                         verified by the physician, the nurse, the anesthetist                         and the technician in the endoscopy suite. Mental                         Status Examination: alert and oriented. Airway                         Examination: normal oropharyngeal airway and neck                         mobility. Respiratory Examination: clear to  auscultation. CV Examination: RRR, no murmurs, no S3                         or S4. Prophylactic Antibiotics: The patient does not                         require prophylactic antibiotics. Prior                          Anticoagulants: The patient has taken Plavix                         (clopidogrel), last dose was 5 days prior to                         procedure. ASA Grade Assessment: III - A patient with                         severe systemic disease. After reviewing the risks and                         benefits, the patient was deemed in satisfactory                         condition to undergo the procedure. The anesthesia                         plan was to use monitored anesthesia care (MAC).                         Immediately prior to administration of medications,                         the patient was re-assessed for adequacy to receive                         sedatives. The heart rate, respiratory rate, oxygen                         saturations, blood pressure, adequacy of pulmonary                         ventilation, and response to care were monitored                         throughout the procedure. The physical status of the                         patient was re-assessed after the procedure.                        After obtaining informed consent, the colonoscope was                         passed under direct vision. Throughout the procedure,                         the patient's blood pressure, pulse, and oxygen  saturations were monitored continuously. The                         Colonoscope was introduced through the anus and                         advanced to the the terminal ileum, with                         identification of the appendiceal orifice and IC                         valve. The colonoscopy was somewhat difficult due to                         sleep apnea, colonic spasm and continuous restless leg                         movement. Successful completion of the procedure was                         aided by increasing the dose of sedation medication.                         The patient tolerated the procedure well. The quality                          of the bowel preparation was evaluated using the BBPS                         [facility] Bowel Preparation Scale) with scores of: Right                         Colon = 2 (minor amount of residual staining, small                         fragments of stool and/or opaque liquid, but mucosa                         seen well), Transverse Colon = 2 (minor amount of                         residual staining, small fragments of stool and/or                         opaque liquid, but mucosa seen well) and Left Colon =                         2 (minor amount of residual staining, small fragments                         of stool and/or opaque liquid, but mucosa seen well).                         The total BBPS score equals 6. The quality of the  bowel preparation was good. The terminal ileum,                         ileocecal valve, appendiceal orifice, and rectum were                         photographed. Findings:      The perianal and digital rectal examinations were normal. Pertinent       negatives include normal sphincter tone.      Non-bleeding internal hemorrhoids were found during retroflexion. The       hemorrhoids were Grade I (internal hemorrhoids that do not prolapse).       Estimated blood loss: none.      Anal papilla(e) were hypertrophied. Estimated blood loss: none.      Multiple small-mouthed diverticula were found in the sigmoid colon.       Estimated blood loss: none.      The terminal ileum appeared normal. Estimated blood loss: none.      Two sessile polyps were found in the descending colon (2) and transverse       colon (2). The polyps were 1 to 2 mm in size. These polyps were removed       with a jumbo cold forceps. Resection and retrieval were complete.       Estimated blood loss was minimal.      Four sessile polyps were found in the sigmoid colon, descending colon       and transverse colon. The polyps were 3 to 6 mm in size. These polyps        were removed with a cold snare. Resection and retrieval were complete.       Estimated blood loss was minimal. To prevent bleeding after the       polypectomy, two hemostatic clips were successfully placed (MR       conditional). There was no bleeding at the end of the procedure. one       clip placed on transverse polypectomy site and other on descending       polypectomy site. Estimated blood loss: none.      The exam was otherwise without abnormality on direct and retroflexion       views. Impression:            - Non-bleeding internal hemorrhoids.                        - Anal papilla(e) were hypertrophied.                        - Diverticulosis in the sigmoid colon.                        - The examined portion of the ileum was normal.                        - Two 1 to 2 mm polyps in the descending colon and in                         the transverse colon, removed with a jumbo cold                         forceps. Resected and retrieved.                        -  Four 3 to 6 mm polyps in the sigmoid colon, in the                         descending colon and in the transverse colon, removed                         with a cold snare. Resected and retrieved. Clips (MR                         conditional) were placed.                        - The examination was otherwise normal on direct and                         retroflexion views. Recommendation:        - Patient has a contact number available for                         emergencies. The signs and symptoms of potential                         delayed complications were discussed with the patient.                         Return to normal activities tomorrow. Written                         discharge instructions were provided to the patient.                        - Discharge patient to home.                        - Resume previous diet.                        - Continue present medications.                        - No ibuprofen,  naproxen, or other non-steroidal                         anti-inflammatory drugs for 5 days after polyp removal.                        - Resume Plavix (clopidogrel) at prior dose in 3 days.                         Refer to managing physician for further adjustment of                         therapy.                        - Await pathology results.                        - Repeat colonoscopy for surveillance based on  pathology results.                        - Return to referring physician as previously                         scheduled.                        - The findings and recommendations were discussed with                         the patient. Procedure Code(s):     --- Professional ---                        [phone], Colonoscopy, flexible; with removal of                         tumor(s), polyp(s), or other lesion(s) by snare                         technique                        45380, 37, Colonoscopy, flexible; with biopsy, single                         or multiple Diagnosis Code(s):     --- Professional ---                        Z86.010, Personal history of colonic polyps                        K62.89, Other specified diseases of anus and rectum                        K64.0, First degree hemorrhoids                        D12.5, Benign neoplasm of sigmoid colon                        D12.4, Benign neoplasm of descending colon                        D12.3, Benign neoplasm of transverse colon (hepatic                         flexure or splenic flexure)                        K57.30, Diverticulosis of large intestine without                         perforation or abscess without bleeding CPT copyright 2022 American [employer] Association. All rights reserved. The codes documented in this report are preliminary and upon coder review may  be revised to meet current compliance requirements. Attending Participation:      I personally performed the entire  procedure. Volney American, DO Annamaria Helling DO, DO 10/25/2022 8:31:09 AM This report has been signed electronically. Number of Addenda: 0 Note  Initiated On: 10/25/2022 7:13 AM Scope Withdrawal Time: 0 hours 24 minutes 39 seconds  Total Procedure Duration: 0 hours 29 minutes 30 seconds  Estimated Blood Loss:  Estimated blood loss was minimal.      [facility]

## 2022-10-25 NOTE — Transfer of Care (Signed)
Immediate Anesthesia Transfer of Care Note  Patient: Robert Haley  Procedure(s) Performed: COLONOSCOPY WITH PROPOFOL  Patient Location: PACU  Anesthesia Type:General  Level of Consciousness: awake and patient cooperative  Airway & Oxygen Therapy: Patient Spontanous Breathing  Post-op Assessment: Report given to RN  Post vital signs: Reviewed  Last Vitals:  Vitals Value Taken Time  BP 114/68 10/25/22 0819  Temp    Pulse 95 10/25/22 0819  Resp 18 10/25/22 0819  SpO2 96 % 10/25/22 0819  Vitals shown include unvalidated device data.  Last Pain:  Vitals:   10/25/22 0708  TempSrc: Temporal  PainSc: 0-No pain         Complications: No notable events documented.

## 2022-10-25 NOTE — Anesthesia Preprocedure Evaluation (Signed)
Anesthesia Evaluation  Patient identified by MRN, date of birth, ID band Patient awake    Reviewed: Allergy & Precautions, NPO status , Patient's Chart, lab work & pertinent test results  Airway Mallampati: III  TM Distance: >3 FB Neck ROM: full    Dental  (+) Teeth Intact   Pulmonary neg pulmonary ROS, sleep apnea and Continuous Positive Airway Pressure Ventilation , former smoker   Pulmonary exam normal breath sounds clear to auscultation       Cardiovascular Exercise Tolerance: Good hypertension, + CAD and + Cardiac Stents  negative cardio ROS Normal cardiovascular exam Rhythm:Regular     Neuro/Psych negative neurological ROS  negative psych ROS   GI/Hepatic negative GI ROS, Neg liver ROS,,,  Endo/Other  negative endocrine ROS  Morbid obesity  Renal/GU negative Renal ROS  negative genitourinary   Musculoskeletal negative musculoskeletal ROS (+)    Abdominal  (+) + obese  Peds negative pediatric ROS (+)  Hematology negative hematology ROS (+)   Anesthesia Other Findings Past Medical History: 01/24/2013: Bicuspid aortic valve No date: Coronary artery disease No date: Dyslipidemia No date: H/O transesophageal echocardiography (TEE) for monitoring No date: Hypertriglyceridemia No date: IBS (irritable bowel syndrome) No date: Sleep apnea  Past Surgical History: No date: CHOLECYSTECTOMY 03/20/2016: COLONOSCOPY WITH PROPOFOL; N/A     Comment:  Procedure: COLONOSCOPY WITH PROPOFOL;  Surgeon: Manya Silvas, MD;  Location: [facility] ENDOSCOPY;  Service:               Endoscopy;  Laterality: N/A; No date: CORONARY ARTERY BYPASS GRAFT No date: HERNIA REPAIR No date: NASAL SEPTUM SURGERY No date: TONSILLECTOMY  BMI    Body Mass Index: 47.26 kg/m      Reproductive/Obstetrics negative OB ROS                             Anesthesia Physical Anesthesia Plan  ASA:  3  Anesthesia Plan: General   Post-op Pain Management:    Induction: Intravenous  PONV Risk Score and Plan: Propofol infusion and TIVA  Airway Management Planned: Natural Airway  Additional Equipment:   Intra-op Plan:   Post-operative Plan:   Informed Consent: I have reviewed the patients History and Physical, chart, labs and discussed the procedure including the risks, benefits and alternatives for the proposed anesthesia with the patient or authorized representative who has indicated his/her understanding and acceptance.     Dental Advisory Given  Plan Discussed with: CRNA and Surgeon  Anesthesia Plan Comments:        Anesthesia Quick Evaluation

## 2022-10-26 ENCOUNTER — Encounter: Payer: Self-pay | Admitting: Gastroenterology

## 2022-10-29 LAB — SURGICAL PATHOLOGY

## 2023-11-26 ENCOUNTER — Encounter: Payer: Self-pay | Admitting: Emergency Medicine

## 2024-01-16 ENCOUNTER — Telehealth: Payer: Self-pay | Admitting: Acute Care

## 2024-01-16 DIAGNOSIS — Z122 Encounter for screening for malignant neoplasm of respiratory organs: Secondary | ICD-10-CM

## 2024-01-16 DIAGNOSIS — Z87891 Personal history of nicotine dependence: Secondary | ICD-10-CM

## 2024-01-16 NOTE — Telephone Encounter (Signed)
 Lung Cancer Screening Narrative/Criteria Questionnaire (Cigarette Smokers Only- No Cigars/Pipes/vapes)   Robert Haley   SDMV:02/05/2024 at 1:15p with Orpha Bur        05-12-1966   LDCT: 02/06/2024 at 4:30p at [facility]    58 y.o.   Phone: [phone]  Lung Screening Narrative (confirm age 59-77 yrs Medicare / 50-80 yrs Private pay insurance)   Insurance information:Medcost   Referring Provider:Dr. Jerl Mina   This screening involves an initial phone call with a team member from our program. It is called a shared decision making visit. The initial meeting is required by  insurance and Medicare to make sure you understand the program. This appointment takes about 15-20 minutes to complete. You will complete the screening scan at your scheduled date/time.  This scan takes about 5-10 minutes to complete. You can eat and drink normally before and after the scan.  Criteria questions for Lung Cancer Screening:   Are you a current or former smoker? Former Age began smoking: 58yo   If you are a former smoker, what year did you quit smoking? 2014 (within 15 yrs)   To calculate your smoking history, I need an accurate estimate of how many packs of cigarettes you smoked per day and for how many years. (Not just the number of PPD you are now smoking)   Years smoking 33 x Packs per day 1 = Pack years 33   (at least 20 pack yrs)   (Make sure they understand that we need to know how much they have smoked in the past, not just the number of PPD they are smoking now)  Do you have a personal history of cancer?  No    Do you have a family history of cancer? Yes  (cancer type and and relative) Father - lung  Are you coughing up blood?  No  Have you had unexplained weight loss of 15 lbs or more in the last 6 months? No  It looks like you meet all criteria.  When would be a good time for [place] to schedule you for this screening?   Additional information: N/A

## 2024-02-05 ENCOUNTER — Encounter: Payer: Self-pay | Admitting: Adult Health

## 2024-02-05 ENCOUNTER — Ambulatory Visit: Payer: PRIVATE HEALTH INSURANCE | Admitting: Adult Health

## 2024-02-05 DIAGNOSIS — Z87891 Personal history of nicotine dependence: Secondary | ICD-10-CM | POA: Diagnosis not present

## 2024-02-05 NOTE — Progress Notes (Signed)
  Virtual Visit via Telephone Note  I connected with Robert Haley , 02/05/24 1:16 PM by a telemedicine application and verified that I am speaking with the correct person using two identifiers.  Location: Patient: home Provider: home   I discussed the limitations of evaluation and management by telemedicine and the availability of in person appointments. The patient expressed understanding and agreed to proceed.   Shared Decision Making Visit Lung Cancer Screening Program [phone])   Eligibility: 58 y.o. Pack Years Smoking History Calculation = 33 pack years  (# packs/per year x # years smoked) Recent History of coughing up blood  no Unexplained weight loss? no ( >Than 15 pounds within the last 6 months ) Prior History Lung / other cancer no (Diagnosis within the last 5 years already requiring surveillance chest CT Scans). Smoking Status Former Smoker Former Smokers: Years since quit: 11 years  Quit Date: 12/25/2012  Visit Components: Discussion included one or more decision making aids. YES Discussion included risk/benefits of screening. YES Discussion included potential follow up diagnostic testing for abnormal scans. YES Discussion included meaning and risk of over diagnosis. YES Discussion included meaning and risk of False Positives. YES Discussion included meaning of total radiation exposure. YES  Counseling Included: Importance of adherence to annual lung cancer LDCT screening. YES Impact of comorbidities on ability to participate in the program. YES Ability and willingness to under diagnostic treatment. YES  Smoking Cessation Counseling: Former Smokers:  Discussed the importance of maintaining cigarette abstinence. yes Diagnosis Code: Personal History of Nicotine Dependence. U27.253 Information about tobacco cessation classes and interventions provided to patient. Yes Patient provided with "ticket" for LDCT Scan. yes Written Order for Lung Cancer Screening with  LDCT placed in Epic. Yes (CT Chest Lung Cancer Screening Low Dose W/O CM) [id number]  Z12.2-Screening of respiratory organs Z87.891-Personal history of nicotine dependence   [name] 02/05/24

## 2024-02-05 NOTE — Patient Instructions (Signed)

## 2024-02-06 ENCOUNTER — Ambulatory Visit
Admission: RE | Admit: 2024-02-06 | Discharge: 2024-02-06 | Disposition: A | Discharge status: Home / Self Care | Payer: PRIVATE HEALTH INSURANCE | Source: Ambulatory Visit | Attending: Acute Care | Admitting: Acute Care

## 2024-02-06 DIAGNOSIS — Z122 Encounter for screening for malignant neoplasm of respiratory organs: Secondary | ICD-10-CM | POA: Insufficient documentation

## 2024-02-06 DIAGNOSIS — Z87891 Personal history of nicotine dependence: Secondary | ICD-10-CM | POA: Diagnosis present

## 2024-03-08 ENCOUNTER — Other Ambulatory Visit: Payer: Self-pay

## 2024-03-08 ENCOUNTER — Telehealth: Payer: Self-pay

## 2024-03-08 DIAGNOSIS — Z87891 Personal history of nicotine dependence: Secondary | ICD-10-CM

## 2024-03-08 DIAGNOSIS — Z122 Encounter for screening for malignant neoplasm of respiratory organs: Secondary | ICD-10-CM

## 2024-03-08 NOTE — Telephone Encounter (Signed)
 Called patient and reviewed results below. He will complete his annual CT again next year. Pt is followed by Cardiologist Dr. Rochelle Chu with [facility]. Advises he has annual cardiac imaging. Advises he has a stress echo and f/u appt next week. He will discuss Lung CT results with him at that time. Reviewed Emphysema and aortic atherosclerosis. He is on a stain. Annual CT order placed. Results and plan will be sent to PCP and Cardiologist.    Impression: Ascending thoracic aortic aneurysm. Recommend semi-annual imaging followup by CTA or MRA and referral to cardiothoracic surgery if not already obtained. This recommendation follows 2010 ACCF/AHA/AATS/ACR/ASA/SCA/SCAI/SIR/STS/SVM Guidelines for the Diagnosis and Management of Patients With Thoracic Aortic Disease. Circulation. 2010; 121: G956-O130. Aortic aneurysm NOS (ICD10-I71.9) 3. Aortic Atherosclerosis (ICD10-I70.0) and Emphysema (ICD10-J43.9).

## 2024-06-25 ENCOUNTER — Ambulatory Visit (INDEPENDENT_AMBULATORY_CARE_PROVIDER_SITE_OTHER): Payer: PRIVATE HEALTH INSURANCE | Admitting: Urology

## 2024-06-25 VITALS — BP 121/68 | HR 52 | Ht 68.0 in | Wt 325.0 lb

## 2024-06-25 DIAGNOSIS — Z125 Encounter for screening for malignant neoplasm of prostate: Secondary | ICD-10-CM

## 2024-06-25 DIAGNOSIS — E291 Testicular hypofunction: Secondary | ICD-10-CM | POA: Diagnosis not present

## 2024-06-25 DIAGNOSIS — N529 Male erectile dysfunction, unspecified: Secondary | ICD-10-CM

## 2024-06-25 NOTE — Patient Instructions (Signed)

## 2024-06-25 NOTE — Progress Notes (Signed)
   06/25/24 9:52 AM   Shan D Noori 1966-08-08 [id number]  CC: Hypogonadism, ED, PSA screening  HPI: Comorbid 58 year old male with morbid obesity and BMI of 50, cardiac history on daily nitrate, on testosterone through PCP referred for ED.  He has never tried medications for this.  Unfortunately he cannot take PDE 5 inhibitors with his daily nitrate.  He uses testosterone 200 mg every 14 days from his PCP.  PSA has been normal, most recently 1.98 from June 2025, urinalysis also benign at that visit.   PMH: Past Medical History:  Diagnosis Date   Bicuspid aortic valve 01/24/2013   Coronary artery disease    Dyslipidemia    H/O transesophageal echocardiography (TEE) for monitoring    Hypertriglyceridemia    IBS (irritable bowel syndrome)    Sleep apnea     Surgical History: Past Surgical History:  Procedure Laterality Date   CHOLECYSTECTOMY     COLONOSCOPY WITH PROPOFOL  N/A 03/20/2016   Procedure: COLONOSCOPY WITH PROPOFOL ;  Surgeon: Lamar ONEIDA Holmes, MD;  Location: [facility] ENDOSCOPY;  Service: Endoscopy;  Laterality: N/A;   COLONOSCOPY WITH PROPOFOL  N/A 10/25/2022   Procedure: COLONOSCOPY WITH PROPOFOL ;  Surgeon: Onita Elspeth Sharper, DO;  Location: [facility] ENDOSCOPY;  Service: Endoscopy;  Laterality: N/A;   CORONARY ARTERY BYPASS GRAFT     HERNIA REPAIR     NASAL SEPTUM SURGERY     TONSILLECTOMY      Social History:  reports that he has quit smoking. His smoking use included cigarettes. He has never used smokeless tobacco. He reports current alcohol use. He reports that he does not use drugs.  Physical Exam: BP 121/68 (BP Location: Left Arm, Patient Position: Sitting, Cuff Size: Large)   Pulse (!) 52   Ht 5' 8 (1.727 m)   Wt (!) 325 lb (147.4 kg)   SpO2 95%   BMI 49.42 kg/m    Constitutional:  Alert and oriented, No acute distress. Cardiovascular: No clubbing, cyanosis, or edema. Respiratory: Normal respiratory effort, no increased work of breathing. GI: Abdomen is soft,  nontender, nondistended, no abdominal masses   Laboratory Data: Reviewed, see HPI  Assessment & Plan:   58 year old male with a number of comorbidities including morbid obesity, CAD, daily nitrate, on testosterone through PCP interested in options for ED.  We reviewed the AUA guidelines regarding ED and hypogonadism, cannot prescribe PDE 5 inhibitors with his daily nitrate.  We discussed other options like penile injections or penile prosthesis.  He is not interested in injections but could be interested in prosthesis in the future.  Regarding recent low testosterone level, he had actually been off injections for at least a few months, and I recommended resuming his injections and after 4 cycles(2 months) checking a morning testosterone 7 days after an injection.  If this was still below 300 could consider increasing the dose.  Continue testosterone through PCP Cannot use PDE 5 inhibitors with daily nitrate Patient not interested in penile injections, can refer to Dr. Lovie in the future if interested in penile prosthesis Follow-up with urology as needed  Redell Burnet, MD 06/25/2024  [facility] [address] [place], [place] [address] [phone]

## 2024-10-11 ENCOUNTER — Encounter: Payer: Self-pay | Admitting: Internal Medicine

## 2024-10-11 ENCOUNTER — Observation Stay
Admission: EM | Admit: 2024-10-11 | Discharge: 2024-10-14 | DRG: 291 | Disposition: A | Payer: PRIVATE HEALTH INSURANCE | Attending: Emergency Medicine | Admitting: Emergency Medicine

## 2024-10-11 ENCOUNTER — Emergency Department: Payer: PRIVATE HEALTH INSURANCE

## 2024-10-11 ENCOUNTER — Other Ambulatory Visit: Payer: Self-pay

## 2024-10-11 DIAGNOSIS — Z8673 Personal history of transient ischemic attack (TIA), and cerebral infarction without residual deficits: Secondary | ICD-10-CM

## 2024-10-11 DIAGNOSIS — Z888 Allergy status to other drugs, medicaments and biological substances status: Secondary | ICD-10-CM

## 2024-10-11 DIAGNOSIS — I110 Hypertensive heart disease with heart failure: Principal | ICD-10-CM | POA: Diagnosis present

## 2024-10-11 DIAGNOSIS — Z1152 Encounter for screening for COVID-19: Secondary | ICD-10-CM

## 2024-10-11 DIAGNOSIS — R001 Bradycardia, unspecified: Secondary | ICD-10-CM | POA: Diagnosis present

## 2024-10-11 DIAGNOSIS — I5033 Acute on chronic diastolic (congestive) heart failure: Secondary | ICD-10-CM | POA: Insufficient documentation

## 2024-10-11 DIAGNOSIS — J189 Pneumonia, unspecified organism: Secondary | ICD-10-CM | POA: Diagnosis present

## 2024-10-11 DIAGNOSIS — Z9089 Acquired absence of other organs: Secondary | ICD-10-CM

## 2024-10-11 DIAGNOSIS — Z23 Encounter for immunization: Secondary | ICD-10-CM

## 2024-10-11 DIAGNOSIS — Z7982 Long term (current) use of aspirin: Secondary | ICD-10-CM

## 2024-10-11 DIAGNOSIS — Z951 Presence of aortocoronary bypass graft: Secondary | ICD-10-CM

## 2024-10-11 DIAGNOSIS — I4892 Unspecified atrial flutter: Secondary | ICD-10-CM | POA: Diagnosis present

## 2024-10-11 DIAGNOSIS — I77810 Thoracic aortic ectasia: Secondary | ICD-10-CM | POA: Diagnosis present

## 2024-10-11 DIAGNOSIS — Z885 Allergy status to narcotic agent status: Secondary | ICD-10-CM

## 2024-10-11 DIAGNOSIS — Z7984 Long term (current) use of oral hypoglycemic drugs: Secondary | ICD-10-CM

## 2024-10-11 DIAGNOSIS — I4891 Unspecified atrial fibrillation: Secondary | ICD-10-CM | POA: Diagnosis present

## 2024-10-11 DIAGNOSIS — Z9889 Other specified postprocedural states: Secondary | ICD-10-CM

## 2024-10-11 DIAGNOSIS — Z955 Presence of coronary angioplasty implant and graft: Secondary | ICD-10-CM

## 2024-10-11 DIAGNOSIS — T447X5A Adverse effect of beta-adrenoreceptor antagonists, initial encounter: Secondary | ICD-10-CM | POA: Diagnosis present

## 2024-10-11 DIAGNOSIS — Z7902 Long term (current) use of antithrombotics/antiplatelets: Secondary | ICD-10-CM

## 2024-10-11 DIAGNOSIS — Q2381 Bicuspid aortic valve: Secondary | ICD-10-CM

## 2024-10-11 DIAGNOSIS — Z6841 Body Mass Index (BMI) 40.0 and over, adult: Secondary | ICD-10-CM

## 2024-10-11 DIAGNOSIS — R0609 Other forms of dyspnea: Secondary | ICD-10-CM

## 2024-10-11 DIAGNOSIS — I2510 Atherosclerotic heart disease of native coronary artery without angina pectoris: Secondary | ICD-10-CM | POA: Diagnosis present

## 2024-10-11 DIAGNOSIS — Z952 Presence of prosthetic heart valve: Secondary | ICD-10-CM

## 2024-10-11 DIAGNOSIS — G4733 Obstructive sleep apnea (adult) (pediatric): Secondary | ICD-10-CM | POA: Diagnosis present

## 2024-10-11 DIAGNOSIS — I2489 Other forms of acute ischemic heart disease: Secondary | ICD-10-CM | POA: Diagnosis present

## 2024-10-11 DIAGNOSIS — E781 Pure hyperglyceridemia: Secondary | ICD-10-CM | POA: Diagnosis present

## 2024-10-11 DIAGNOSIS — Z9049 Acquired absence of other specified parts of digestive tract: Secondary | ICD-10-CM

## 2024-10-11 DIAGNOSIS — J9601 Acute respiratory failure with hypoxia: Secondary | ICD-10-CM | POA: Diagnosis present

## 2024-10-11 DIAGNOSIS — Z7901 Long term (current) use of anticoagulants: Secondary | ICD-10-CM

## 2024-10-11 DIAGNOSIS — K589 Irritable bowel syndrome without diarrhea: Secondary | ICD-10-CM | POA: Diagnosis present

## 2024-10-11 DIAGNOSIS — J441 Chronic obstructive pulmonary disease with (acute) exacerbation: Secondary | ICD-10-CM | POA: Diagnosis present

## 2024-10-11 DIAGNOSIS — Z87891 Personal history of nicotine dependence: Secondary | ICD-10-CM

## 2024-10-11 DIAGNOSIS — Z79899 Other long term (current) drug therapy: Secondary | ICD-10-CM

## 2024-10-11 LAB — COMPREHENSIVE METABOLIC PANEL WITH GFR
ALT: 34 U/L (ref 0–44)
AST: 38 U/L (ref 15–41)
Albumin: 4.2 g/dL (ref 3.5–5.0)
Alkaline Phosphatase: 114 U/L (ref 38–126)
Anion gap: 9 (ref 5–15)
BUN: 16 mg/dL (ref 6–20)
CO2: 27 mmol/L (ref 22–32)
Calcium: 9.2 mg/dL (ref 8.9–10.3)
Chloride: 102 mmol/L (ref 98–111)
Creatinine, Ser: 1 mg/dL (ref 0.61–1.24)
GFR, Estimated: >60 mL/min (ref >60)
Glucose, Bld: 113 mg/dL — ABNORMAL HIGH (ref 70–99)
Potassium: 4.8 mmol/L (ref 3.5–5.1)
Sodium: 139 mmol/L (ref 135–145)
Total Bilirubin: 1.3 mg/dL — ABNORMAL HIGH (ref 0.0–1.2)
Total Protein: 6.9 g/dL (ref 6.5–8.1)

## 2024-10-11 LAB — CBC WITH DIFFERENTIAL/PLATELET
Abs Immature Granulocytes: 0.02 K/uL (ref 0.00–0.07)
Basophils Absolute: 0.1 K/uL (ref 0.0–0.1)
Basophils Relative: 1 %
Eosinophils Absolute: 0.1 K/uL (ref 0.0–0.5)
Eosinophils Relative: 1 %
HCT: 45.3 % (ref 39.0–52.0)
Hemoglobin: 14.9 g/dL (ref 13.0–17.0)
Immature Granulocytes: 0 %
Lymphocytes Relative: 10 %
Lymphs Abs: 0.7 K/uL (ref 0.7–4.0)
MCH: 31.6 pg (ref 26.0–34.0)
MCHC: 32.9 g/dL (ref 30.0–36.0)
MCV: 96 fL (ref 80.0–100.0)
Monocytes Absolute: 0.6 K/uL (ref 0.1–1.0)
Monocytes Relative: 9 %
Neutro Abs: 5.6 K/uL (ref 1.7–7.7)
Neutrophils Relative %: 79 %
Platelets: 189 K/uL (ref 150–400)
RBC: 4.72 MIL/uL (ref 4.22–5.81)
RDW: 13.9 % (ref 11.5–15.5)
WBC: 7.1 K/uL (ref 4.0–10.5)
nRBC: 0 % (ref 0.0–0.2)

## 2024-10-11 LAB — URINALYSIS, W/ REFLEX TO CULTURE (INFECTION SUSPECTED)
Bilirubin Urine: NEGATIVE
Glucose, UA: NEGATIVE mg/dL
Hgb urine dipstick: NEGATIVE
Ketones, ur: NEGATIVE mg/dL
Leukocytes,Ua: NEGATIVE
Nitrite: NEGATIVE
Protein, ur: 30 mg/dL — AB
Specific Gravity, Urine: 1.025 (ref 1.005–1.030)
pH: 5 (ref 5.0–8.0)

## 2024-10-11 LAB — URINALYSIS, COMPLETE (UACMP) WITH MICROSCOPIC
Bacteria, UA: NONE SEEN
Bilirubin Urine: NEGATIVE
Glucose, UA: NEGATIVE mg/dL
Hgb urine dipstick: NEGATIVE
Ketones, ur: NEGATIVE mg/dL
Leukocytes,Ua: NEGATIVE
Nitrite: NEGATIVE
Protein, ur: NEGATIVE mg/dL
Specific Gravity, Urine: 1.013 (ref 1.005–1.030)
Squamous Epithelial / HPF: 0 /HPF (ref 0–5)
WBC, UA: 0 WBC/hpf (ref 0–5)
pH: 5 (ref 5.0–8.0)

## 2024-10-11 LAB — T4, FREE: Free T4: 1.12 ng/dL (ref 0.61–1.12)

## 2024-10-11 LAB — PROCALCITONIN: Procalcitonin: <0.1 ng/mL

## 2024-10-11 LAB — TROPONIN T, HIGH SENSITIVITY
Troponin T High Sensitivity: 36 ng/L — ABNORMAL HIGH (ref 0–19)
Troponin T High Sensitivity: 40 ng/L — ABNORMAL HIGH (ref 0–19)

## 2024-10-11 LAB — RESP PANEL BY RT-PCR (RSV, FLU A&B, COVID)  RVPGX2
Influenza A by PCR: NEGATIVE
Influenza B by PCR: NEGATIVE
Resp Syncytial Virus by PCR: NEGATIVE
SARS Coronavirus 2 by RT PCR: NEGATIVE

## 2024-10-11 LAB — TSH: TSH: 2.63 u[IU]/mL (ref 0.350–4.500)

## 2024-10-11 LAB — PRO BRAIN NATRIURETIC PEPTIDE: Pro Brain Natriuretic Peptide: 2025 pg/mL — ABNORMAL HIGH (ref <300.0)

## 2024-10-11 MED ORDER — ACETAMINOPHEN 325 MG PO TABS: 650.0000 mg | ORAL_TABLET | ORAL | Status: DC | PRN

## 2024-10-11 MED ORDER — SODIUM CHLORIDE 0.9 % IV SOLN: 250.0000 mL | INTRAVENOUS | Status: AC | PRN

## 2024-10-11 MED ORDER — IOHEXOL 350 MG/ML SOLN
75.0000 mL | Freq: Once | INTRAVENOUS | Status: AC | PRN
  Administered 2024-10-11: 13:00:00 75 mL via INTRAVENOUS

## 2024-10-11 MED ORDER — ENOXAPARIN SODIUM 80 MG/0.8ML IJ SOSY
75.0000 mg | PREFILLED_SYRINGE | INTRAMUSCULAR | Status: DC
  Administered 2024-10-12: 01:00:00 75 mg via SUBCUTANEOUS
  Filled 2024-10-11: qty 0.75

## 2024-10-11 MED ORDER — CLOPIDOGREL BISULFATE 75 MG PO TABS
75.0000 mg | ORAL_TABLET | Freq: Every day | ORAL | Status: DC
  Administered 2024-10-12 – 2024-10-14 (×3): 75 mg via ORAL
  Filled 2024-10-11 (×3): qty 1

## 2024-10-11 MED ORDER — ATORVASTATIN CALCIUM 80 MG PO TABS
80.0000 mg | ORAL_TABLET | Freq: Every evening | ORAL | Status: DC
  Administered 2024-10-11 – 2024-10-13 (×3): 80 mg via ORAL
  Filled 2024-10-11 (×3): qty 4

## 2024-10-11 MED ORDER — FUROSEMIDE 10 MG/ML IJ SOLN
40.0000 mg | Freq: Two times a day (BID) | INTRAMUSCULAR | Status: DC
  Administered 2024-10-11: 17:00:00 40 mg via INTRAVENOUS
  Filled 2024-10-11: qty 4

## 2024-10-11 MED ORDER — ISOSORBIDE MONONITRATE ER 60 MG PO TB24
30.0000 mg | ORAL_TABLET | Freq: Two times a day (BID) | ORAL | Status: DC
  Administered 2024-10-12 – 2024-10-14 (×5): 30 mg via ORAL
  Filled 2024-10-11 (×6): qty 1

## 2024-10-11 MED ORDER — AMLODIPINE BESYLATE 5 MG PO TABS
5.0000 mg | ORAL_TABLET | Freq: Every day | ORAL | Status: DC
  Administered 2024-10-13 – 2024-10-14 (×2): 5 mg via ORAL
  Filled 2024-10-11 (×3): qty 1

## 2024-10-11 MED ORDER — FUROSEMIDE 10 MG/ML IJ SOLN
40.0000 mg | Freq: Once | INTRAMUSCULAR | Status: AC
  Administered 2024-10-11: 14:00:00 40 mg via INTRAVENOUS
  Filled 2024-10-11: qty 4

## 2024-10-11 MED ORDER — EZETIMIBE 10 MG PO TABS
10.0000 mg | ORAL_TABLET | Freq: Every day | ORAL | Status: DC
  Administered 2024-10-12 – 2024-10-14 (×3): 10 mg via ORAL
  Filled 2024-10-11 (×3): qty 1

## 2024-10-11 MED ORDER — SERTRALINE HCL 50 MG PO TABS
100.0000 mg | ORAL_TABLET | Freq: Every day | ORAL | Status: DC
  Administered 2024-10-12 – 2024-10-13 (×2): 100 mg via ORAL
  Filled 2024-10-11 (×2): qty 2

## 2024-10-11 MED ORDER — FUROSEMIDE 10 MG/ML IJ SOLN
40.0000 mg | Freq: Two times a day (BID) | INTRAMUSCULAR | Status: DC
  Administered 2024-10-12 – 2024-10-13 (×4): 40 mg via INTRAVENOUS
  Filled 2024-10-11 (×4): qty 4

## 2024-10-11 MED ORDER — SODIUM CHLORIDE 0.9% FLUSH
3.0000 mL | Freq: Two times a day (BID) | INTRAVENOUS | Status: DC
  Administered 2024-10-11 – 2024-10-14 (×7): 3 mL via INTRAVENOUS

## 2024-10-11 MED ORDER — ONDANSETRON HCL 4 MG/2ML IJ SOLN: 4.0000 mg | Freq: Four times a day (QID) | INTRAMUSCULAR | Status: DC | PRN

## 2024-10-11 MED ORDER — SODIUM CHLORIDE 0.9% FLUSH: 3.0000 mL | INTRAVENOUS | Status: DC | PRN

## 2024-10-11 NOTE — Progress Notes (Signed)
 PHARMACIST - PHYSICIAN COMMUNICATION  CONCERNING:  Enoxaparin  (Lovenox ) for DVT Prophylaxis    RECOMMENDATION: Patient was prescribed enoxaprin 40mg  q24 hours for VTE prophylaxis.   Filed Weights   10/11/24 0918  Weight: (!) 149.7 kg (330 lb)    Body mass index is 50.18 kg/m.  Estimated Creatinine Clearance: 114.9 mL/min (by C-G formula based on SCr of 1 mg/dL).   Based on [facility] policy patient is candidate for enoxaparin  0.5mg /kg TBW SQ every 24 hours based on BMI being >30.  DESCRIPTION: Pharmacy has adjusted enoxaparin  dose per [facility] policy.  Patient is now receiving enoxaparin  75 mg every 24 hours    Kayla JULIANNA Blew, PharmD Clinical Pharmacist  10/11/2024 3:40 PM

## 2024-10-11 NOTE — Progress Notes (Signed)
 Heart Failure Navigator Progress Note  Assessed for Heart & Vascular TOC clinic readiness.  Patient does not meet criteria due to current [facility] patient of Dr. Elsie Dwain Molt.   Navigator will sign off at this time.  Charmaine Pines, RN, BSN [facility] Heart Failure Navigator Secure Chat Only

## 2024-10-11 NOTE — Discharge Instructions (Addendum)

## 2024-10-11 NOTE — H&P (Signed)
 History and Physical    Robert Haley:[id number] DOB: [birth year] DOA: 10/11/2024  PCP: Robert Lynwood FALCON, MD (Confirm with patient/family/NH records and if not entered, this has to be entered at [facility] point of entry) Patient coming from: Home  I have personally briefly reviewed patient's old medical records in [facility] Link  Chief Complaint: SOB  HPI: Robert Haley is a 58 y.o. male with medical history significant of CAD status post CABG and stenting, HTN, HLD, OSA on CPAP at bedtime, presented with increasing shortness of breath.  Symptoms started about 2 weeks ago when patient started to have increasing exertional dyspnea, gradually getting worse, yesterday even walking into his home health close significant shortness of breath.  Denies any cough no chest pain.  No ankle edema.  He also admitted increasing orthopnea.  Denies any lightheadedness palpitations.  He has a history of multivessel CAD status post CABG and multiple stenting, followed with [facility] cardiology and had annual stress test May this year which showed normal stress test.  Denied any recent medication changes. ED Course: Heart rate ranging from 40-60, CTA negative for PE but signs of multifocal pneumonia versus pulmonary vascular congestion, WBC 7.1 hemoglobin 14.9, TSH 2.6, troponin 36> 40.  Patient was given IV Lasix  in the ED.  Review of Systems: As per HPI otherwise 14 point review of systems negative.    Past Medical History:  Diagnosis Date   Bicuspid aortic valve 01/24/2013   Coronary artery disease    Dyslipidemia    H/O transesophageal echocardiography (TEE) for monitoring    Hypertriglyceridemia    IBS (irritable bowel syndrome)    Sleep apnea     Past Surgical History:  Procedure Laterality Date   CHOLECYSTECTOMY     COLONOSCOPY WITH PROPOFOL  N/A 03/20/2016   Procedure: COLONOSCOPY WITH PROPOFOL ;  Surgeon: Robert ONEIDA Holmes, MD;  Location: [facility] ENDOSCOPY;  Service: Endoscopy;  Laterality: N/A;    COLONOSCOPY WITH PROPOFOL  N/A 10/25/2022   Procedure: COLONOSCOPY WITH PROPOFOL ;  Surgeon: Robert Elspeth Sharper, DO;  Location: [facility] ENDOSCOPY;  Service: Endoscopy;  Laterality: N/A;   CORONARY ARTERY BYPASS GRAFT     HERNIA REPAIR     NASAL SEPTUM SURGERY     TONSILLECTOMY       reports that he has quit smoking. His smoking use included cigarettes. He has never used smokeless tobacco. He reports current alcohol use. He reports that he does not use drugs.  Allergies[1]  No family history on file.   Prior to Admission medications  Medication Sig Start Date End Date Taking? Authorizing Provider  amLODipine  (NORVASC ) 5 MG tablet Take 5 mg by mouth daily.   Yes [provider]  atorvastatin  (LIPITOR) 80 MG tablet Take 80 mg by mouth daily.   Yes [provider]  clopidogrel  (PLAVIX ) 75 MG tablet Take 75 mg by mouth daily.   Yes [provider]  ezetimibe  (ZETIA ) 10 MG tablet Take 10 mg by mouth daily.   Yes [provider]  isosorbide  mononitrate (IMDUR ) 30 MG 24 hr tablet Take 30 mg by mouth 2 (two) times daily.   Yes [provider]  metoprolol (LOPRESSOR) 50 MG tablet Take 50 mg by mouth 2 (two) times daily.   Yes [provider]  nitroGLYCERIN (NITROSTAT) 0.4 MG SL tablet Place under the tongue. 09/30/16  Yes [provider]  sertraline  (ZOLOFT ) 100 MG tablet Take 100 mg by mouth daily.   Yes [provider]  testosterone cypionate (DEPOTESTOSTERONE CYPIONATE)  200 MG/ML injection SMARTSIG:Milliliter(s) IM   Yes [provider]  aspirin  81 MG tablet Take 81 mg by mouth daily. Patient not taking: Reported on 10/11/2024    [provider]  buPROPion (WELLBUTRIN SR) 150 MG 12 hr tablet Take 150 mg by mouth 2 (two) times daily. Patient not taking: Reported on 10/11/2024    [provider]  Multiple Vitamin (MULTIVITAMIN) tablet Take 1 tablet by mouth daily. Patient not taking: Reported on  10/11/2024    [provider]  OMEGA-3 FATTY ACIDS PO Take 1,000 mg by mouth daily. Patient not taking: Reported on 10/11/2024    [provider]  semaglutide-weight management (WEGOVY) 0.25 MG/0.5ML SOAJ SQ injection Inject 0.25 mg into the skin once a week. Patient not taking: Reported on 10/11/2024 03/27/23   [provider]  zinc gluconate 50 MG tablet Take 50 mg by mouth daily. Patient not taking: Reported on 10/11/2024    [provider]    Physical Exam: Vitals:   10/11/24 1114 10/11/24 1115 10/11/24 1350 10/11/24 1530  BP:  108/68  (!) 106/56  Pulse: 67 (!) 40  (!) 39  Resp: 16 16    Temp:   98 F (36.7 C)   TempSrc:   Oral   SpO2:      Weight:      Height:        Constitutional: NAD, calm, comfortable Vitals:   10/11/24 1114 10/11/24 1115 10/11/24 1350 10/11/24 1530  BP:  108/68  (!) 106/56  Pulse: 67 (!) 40  (!) 39  Resp: 16 16    Temp:   98 F (36.7 C)   TempSrc:   Oral   SpO2:      Weight:      Height:       Eyes: PERRL, lids and conjunctivae normal ENMT: Mucous membranes are moist. Posterior pharynx clear of any exudate or lesions.Normal dentition.  Neck: normal, supple, no masses, no thyromegaly Respiratory: clear to auscultation bilaterally, no wheezing, fine crackles on bilateral lower fields, increasing respiratory effort. No accessory muscle use.  Cardiovascular: Regular rate and rhythm, no murmurs / rubs / gallops. No extremity edema. 2+ pedal pulses. No carotid bruits.  Abdomen: no tenderness, no masses palpated. No hepatosplenomegaly. Bowel sounds positive.  Musculoskeletal: no clubbing / cyanosis. No joint deformity upper and lower extremities. Good ROM, no contractures. Normal muscle tone.  Skin: no rashes, lesions, ulcers. No induration Neurologic: CN 2-12 grossly intact. Sensation intact, DTR normal. Strength 5/5 in all 4.  Psychiatric: Normal judgment and insight. Alert and oriented x 3. Normal mood.      Labs on Admission: I have personally reviewed following labs and imaging studies  CBC: Recent Labs  Lab 10/11/24 0959  WBC 7.1  NEUTROABS 5.6  HGB 14.9  HCT 45.3  MCV 96.0  PLT 189   Basic Metabolic Panel: Recent Labs  Lab 10/11/24 0959  NA 139  K 4.8  CL 102  CO2 27  GLUCOSE 113*  BUN 16  CREATININE 1.00  CALCIUM  9.2   GFR: Estimated Creatinine Clearance: 114.9 mL/min (by C-G formula based on SCr of 1 mg/dL). Liver Function Tests: Recent Labs  Lab 10/11/24 0959  AST 38  ALT 34  ALKPHOS 114  BILITOT 1.3*  PROT 6.9  ALBUMIN 4.2   No results for input(s): LIPASE, AMYLASE in the last 168 hours. No results for input(s): AMMONIA in the last 168 hours. Coagulation Profile: No results for input(s): INR, PROTIME in the  last 168 hours. Cardiac Enzymes: No results for input(s): CKTOTAL, CKMB, CKMBINDEX, TROPONINI in the last 168 hours. BNP (last 3 results) Recent Labs    10/11/24 0959  PROBNP 2,025.0*   HbA1C: No results for input(s): HGBA1C in the last 72 hours. CBG: No results for input(s): GLUCAP in the last 168 hours. Lipid Profile: No results for input(s): CHOL, HDL, LDLCALC, TRIG, CHOLHDL, LDLDIRECT in the last 72 hours. Thyroid Function Tests: Recent Labs    10/11/24 1328  TSH 2.630  FREET4 1.12   Anemia Panel: No results for input(s): VITAMINB12, FOLATE, FERRITIN, TIBC, IRON, RETICCTPCT in the last 72 hours. Urine analysis:    Component Value Date/Time   COLORURINE STRAW (A) 10/11/2024 1643   APPEARANCEUR CLEAR (A) 10/11/2024 1643   LABSPEC 1.013 10/11/2024 1643   PHURINE 5.0 10/11/2024 1643   GLUCOSEU NEGATIVE 10/11/2024 1643   HGBUR NEGATIVE 10/11/2024 1643   BILIRUBINUR NEGATIVE 10/11/2024 1643   KETONESUR NEGATIVE 10/11/2024 1643   PROTEINUR NEGATIVE 10/11/2024 1643   NITRITE NEGATIVE 10/11/2024 1643   LEUKOCYTESUR NEGATIVE 10/11/2024 1643    Radiological Exams on Admission: CT  Angio Chest PE W and/or Wo Contrast Result Date: 10/11/2024 EXAM: CTA of the Chest with contrast for PE 10/11/2024 12:57:40 PM TECHNIQUE: CTA of the chest was performed after the administration of intravenous contrast. Multiplanar reformatted images are provided for review. MIP images are provided for review. Automated exposure control, iterative reconstruction, and/or weight based adjustment of the mA/kV was utilized to reduce the radiation dose to as low as reasonably achievable. COMPARISON: Chest radiograph 10/11/2024 and CT chest 02/06/2024. CLINICAL HISTORY: Pulmonary embolism (PE) suspected, high prob. Worsening shortness of breath for 10 days. FINDINGS: PULMONARY ARTERIES: Moderate opacification of the central and segmental pulmonary arteries with mild motion artifact. No focal filling defects are identified. No evidence of significant pulmonary embolism. Main pulmonary artery is normal in caliber. MEDIASTINUM: Postoperative changes with aortic valve replacement, median sternotomy, and coronary artery bypass grafts. Normal heart size. No pericardial effusions. 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 The thyroid gland is unremarkable. The esophagus is decompressed. LYMPH NODES: The mediastinal lymph nodes are not pathologically enlarged, likely reactive. No hilar or axillary lymphadenopathy. LUNGS AND PLEURA: Motion artifact limits evaluation of the lungs. There is diffuse infiltration throughout the left lung base with focal areas of infiltration in the right lung base. This is likely multifocal pneumonia. There is a small left pleural effusion. No pneumothorax. UPPER ABDOMEN: Visualized upper abdominal contents are unremarkable. SOFT TISSUES AND BONES: No acute bone or soft tissue abnormality. IMPRESSION: 1. No evidence of significant pulmonary embolism. 2. Likely multifocal pneumonia. Motion artifact limits evaluation of the lungs. 3. Small left pleural effusion. 4. Aneurysm  of the ascending thoracic aorta measuring 5.2 cm in diameter. Yearly follow-up is recommended. Electronically signed by: Elsie Gravely MD 10/11/2024 01:27 PM EST RP Workstation: [id number]   DG Chest Portable 1 View Result Date: 10/11/2024 CLINICAL DATA:  Shortness of breath. EXAM: PORTABLE CHEST 1 VIEW COMPARISON:  Chest radiograph dated 07/11/2016. FINDINGS: Cardiomegaly with vascular congestion. No focal consolidation or pleural effusion or pneumothorax. Median sternotomy wires and CABG vascular clips. No acute osseous pathology. IMPRESSION: Cardiomegaly with vascular congestion. No focal consolidation. Electronically Signed   By: Vanetta Chou M.D.   On: 10/11/2024 10:26    EKG: Independently reviewed.  Sinus rhythm, no acute ST changes.  Assessment/Plan Principal Problem:   CHF (congestive heart failure) (HCC) Active Problems:   Acute on  chronic diastolic CHF (congestive heart failure) (HCC)  (please populate well all problems here in Problem List. (For example, if patient is on BP meds at home and you resume or decide to hold them, it is a problem that needs to be her. Same for CAD, COPD, HLD and so on)  Acute CHF decompensation Symptomatic bradycardia - Clinically suspect acute CHF decompensation secondary to symptomatic bradycardia. - Symptomatic bradycardia secondary to beta-blocker, hold off metoprolol. -Still has symptoms signs of fluid overload, continue IV diuresis Lasix  40 mg twice daily -Echocardiogram -TSH normal, UA showed no proteinuria - Other DDx, patient has a history of multivessel CAD, he reported has been taking metoprolol  for years without issue.  Will consult cardiology for medication optimization.    CAD HTN - BP borderline low, resume amlodipine  and Imdur  tomorrow - Hold off metoprolol - Continue aspirin  Plavix  and statin  Elevated troponins - No chest pains - Likely demand ischemia secondary to CHF decompensation.  Morbid obesity - BMI= 50 -  Already on Wegovy therapy   DVT prophylaxis: Lovenox  Code Status: Full code Family Communication: Daughter at bedside Disposition Plan: Expect less than 2 midnight hospital. Consults called: [facility] cardiology Admission status: PCU observation   Cort ONEIDA Mana MD [facility] Pager [phone]  10/11/2024, 5:35 PM       [1]  Allergies Allergen Reactions   Morphine Nausea Only   Definity [Perflutren Lipid Microsphere]    Morphine And Codeine

## 2024-10-11 NOTE — ED Notes (Signed)
 Patient transported to CT

## 2024-10-11 NOTE — ED Provider Notes (Addendum)
 [facility] Provider Note    Event Date/Time   First MD Initiated Contact with Patient 10/11/24 [phone]     (approximate)   History   Chief Complaint: Shortness of Breath   HPI  Robert Haley is a 58 y.o. male with a history of CAD, prior stroke who comes ED complaining of shortness of breath for the last 10 days, gradual onset and worsening.  No chest pain fever or cough.  No vomiting diarrhea.  Normal oral intake.  Has noticed some mild swelling of both legs.  Has orthopnea, dyspnea on exertion, last night could not sleep.        Past Medical History:  Diagnosis Date   Bicuspid aortic valve 01/24/2013   Coronary artery disease    Dyslipidemia    H/O transesophageal echocardiography (TEE) for monitoring    Hypertriglyceridemia    IBS (irritable bowel syndrome)    Sleep apnea     Current Outpatient Rx   Order #: [id number] Class: Historical Med   Order #: [id number] Class: Historical Med   Order #: [id number] Class: Historical Med   Order #: [id number] Class: Historical Med   Order #: [id number] Class: Historical Med   Order #: [id number] Class: Historical Med   Order #: [id number] Class: Historical Med   Order #: [id number] Class: Historical Med   Order #: [id number] Class: Historical Med   Order #: [id number] Class: Historical Med   Order #: [id number] Class: Historical Med   Order #: [id number] Class: Historical Med   Order #: [id number] Class: Historical Med   Order #: [id number] Class: Historical Med   Order #: [id number] Class: Historical Med    Past Surgical History:  Procedure Laterality Date   CHOLECYSTECTOMY     COLONOSCOPY WITH PROPOFOL  N/A 03/20/2016   Procedure: COLONOSCOPY WITH PROPOFOL ;  Surgeon: Lamar ONEIDA Holmes, MD;  Location: [facility] ENDOSCOPY;  Service: Endoscopy;  Laterality: N/A;   COLONOSCOPY WITH PROPOFOL  N/A 10/25/2022   Procedure: COLONOSCOPY WITH PROPOFOL ;  Surgeon: Onita Elspeth Sharper, DO;  Location: [facility] ENDOSCOPY;  Service: Endoscopy;   Laterality: N/A;   CORONARY ARTERY BYPASS GRAFT     HERNIA REPAIR     NASAL SEPTUM SURGERY     TONSILLECTOMY      Physical Exam   Triage Vital Signs: ED Triage Vitals  Encounter Vitals Group     BP 10/11/24 0921 134/86     Girls Systolic BP Percentile --      Girls Diastolic BP Percentile --      Boys Systolic BP Percentile --      Boys Diastolic BP Percentile --      Pulse Rate 10/11/24 0920 81     Resp 10/11/24 0920 20     Temp 10/11/24 0920 98.3 F (36.8 C)     Temp Source 10/11/24 0920 Oral     SpO2 10/11/24 0921 93 %     Weight 10/11/24 0918 (!) 330 lb (149.7 kg)     Height 10/11/24 0918 5' 8 (1.727 m)     Head Circumference --      Peak Flow --      Pain Score 10/11/24 0918 0     Pain Loc --      Pain Education --      Exclude from Growth Chart --     Most recent vital signs: Vitals:   10/11/24 1114 10/11/24 1115  BP:  108/68  Pulse: 67 (!) 40  Resp: 16 16  Temp:    SpO2:      General: Awake, no distress.  CV:  Good peripheral perfusion.  Bradycardia heart rate 50 Resp:  Normal effort.  Clear lungs.  Oxygen saturation 91% on room air while at rest Abd:  No distention.  Soft nontender Other:  1+ pitting edema bilateral lower extremities   ED Results / Procedures / Treatments   Labs (all labs ordered are listed, but only abnormal results are displayed) Labs Reviewed  COMPREHENSIVE METABOLIC PANEL WITH GFR - Abnormal; Notable for the following components:      Result Value   Glucose, Bld 113 (*)    Total Bilirubin 1.3 (*)    All other components within normal limits  PRO BRAIN NATRIURETIC PEPTIDE - Abnormal; Notable for the following components:   Pro Brain Natriuretic Peptide 2,025.0 (*)    All other components within normal limits  URINALYSIS, W/ REFLEX TO CULTURE (INFECTION SUSPECTED) - Abnormal; Notable for the following components:   Color, Urine AMBER (*)    APPearance CLEAR (*)    Protein, ur 30 (*)    Bacteria, UA RARE (*)    All other  components within normal limits  TROPONIN T, HIGH SENSITIVITY - Abnormal; Notable for the following components:   Troponin T High Sensitivity 36 (*)    All other components within normal limits  RESP PANEL BY RT-PCR (RSV, FLU A&B, COVID)  RVPGX2  CBC WITH DIFFERENTIAL/PLATELET  T4, FREE  TSH  TROPONIN T, HIGH SENSITIVITY     EKG Interpreted by me Normal sinus rhythm rate of 80.  Normal axis and intervals.  Normal QRS ST segments and T waves.   RADIOLOGY Chest x-ray interpreted by me, unremarkable.  Radiology report reviewed. CTA chest negative for PE, suspicious for multifocal pneumonia    PROCEDURES:  Procedures   MEDICATIONS ORDERED IN ED: Medications  furosemide  (LASIX ) injection 40 mg (has no administration in time range)  iohexol  (OMNIPAQUE ) 350 MG/ML injection 75 mL (75 mLs Intravenous Contrast Given 10/11/24 1234)     IMPRESSION / MDM / ASSESSMENT AND PLAN / ED COURSE  I reviewed the triage vital signs and the nursing notes.  DDx: NSTEMI, CHF, PE, pneumonia, pleural effusion, pneumothorax, AKI, anemia, electrolyte derangement  Patient's presentation is most consistent with acute presentation with potential threat to life or bodily function.  Patient presents with worsening shortness of breath, DOE, orthopnea for the past 10 days.  Initial labs and chest x-ray unremarkable except for markedly elevated proBNP.  CT chest suggestive of pneumonia, however he has no leukocytosis or fever or cough.  Suspect this is more of an atypical edema picture, will give furosemide .  Plan to admit for further cardiac workup.    ----------------------------------------- 1:42 PM on 10/11/2024 ----------------------------------------- Case discussed with hospitalist     FINAL CLINICAL IMPRESSION(S) / ED DIAGNOSES   Final diagnoses:  New onset of congestive heart failure (HCC)  DOE (dyspnea on exertion)     Rx / DC Orders   ED Discharge Orders     None         Note:  This document was prepared using Dragon voice recognition software and may include unintentional dictation errors.   Viviann Pastor, MD 10/11/24 1333    Viviann Pastor, MD 10/11/24 1333    Viviann Pastor, MD 10/11/24 [phone]

## 2024-10-11 NOTE — ED Triage Notes (Signed)
 C/O worsening SO x 10 days.  Worse with exertion. Denies chest pain. Denies cough.  AAOx3. Skin warm and dry. DOE appreciated.

## 2024-10-11 NOTE — Consult Note (Signed)
 CARDIOLOGY CONSULT NOTE               Patient ID: Robert ABDELAZIZ MRN: [id number] DOB/AGE: 58-Jul-1967 58 y.o.  Admit date: 10/11/2024 Referring Physician Dr. Cort Mana hospitalist Primary Physician Dr. Lynwood Null primary Primary Cardiologist Dr. Dwain Molt [place] Reason for Consultation shortness of breath congestive heart failure  HPI: 58 year old male extensive cardiac history reportedly has had a bicuspid aortic valve multivessel coronary disease coronary bypass surgery with stenting hypertension hyperlipidemia obstructive sleep apnea on CPAP presented with increasing dyspnea shortness of breath over the last few weeks denies any significant chest pain he is normally followed at [facility] by Dr. Molt he called rescue and was brought in EKG was nondiagnostic but appeared to be somewhat dyspneic and relatively pulmonary congestion.  Patient recently had a stress test in May has been treated medically denies previous history of heart failure has had preserved left ventricular function and has been compliant with this medication and emergency room he was treated with IV diuresis supplemental oxygen to help his proBNP was over 2000 troponins were relatively negative  Review of systems complete and found to be negative unless listed above     Past Medical History:  Diagnosis Date   Bicuspid aortic valve 01/24/2013   Coronary artery disease    Dyslipidemia    H/O transesophageal echocardiography (TEE) for monitoring    Hypertriglyceridemia    IBS (irritable bowel syndrome)    Sleep apnea     Past Surgical History:  Procedure Laterality Date   CHOLECYSTECTOMY     COLONOSCOPY WITH PROPOFOL  N/A 03/20/2016   Procedure: COLONOSCOPY WITH PROPOFOL ;  Surgeon: Lamar ONEIDA Holmes, MD;  Location: [facility] ENDOSCOPY;  Service: Endoscopy;  Laterality: N/A;   COLONOSCOPY WITH PROPOFOL  N/A 10/25/2022   Procedure: COLONOSCOPY WITH PROPOFOL ;  Surgeon: Onita Elspeth Sharper, DO;  Location: [facility] ENDOSCOPY;   Service: Endoscopy;  Laterality: N/A;   CORONARY ARTERY BYPASS GRAFT     HERNIA REPAIR     NASAL SEPTUM SURGERY     TONSILLECTOMY      (Not in a hospital admission)  Social History   Socioeconomic History   Marital status: Married    Spouse name: Not on file   Number of children: Not on file   Years of education: Not on file   Highest education level: Not on file  Occupational History   Not on file  Tobacco Use   Smoking status: Former    Types: Cigarettes   Smokeless tobacco: Never  Vaping Use   Vaping status: Never Used  Substance and Sexual Activity   Alcohol use: Yes    Comment: OCCASIONALLY   Drug use: No   Sexual activity: Not on file  Other Topics Concern   Not on file  Social History Narrative   Not on file   Social Drivers of Health   Tobacco Use: Medium Risk (10/11/2024)   Patient History    Smoking Tobacco Use: Former    Smokeless Tobacco Use: Never    Passive Exposure: Not on file  Financial Resource Strain: Low Risk  (04/28/2024)   Received from [facility]   Overall Financial Resource Strain (CARDIA)    Difficulty of Paying Living Expenses: Not hard at all  Food Insecurity: No Food Insecurity (04/28/2024)   Received from [facility]   Epic    Within the past 12 months, you worried that your food would run out before you got the money to buy more.:  Never true    Within the past 12 months, the food you bought just didn't last and you didn't have money to get more.: Never true  Transportation Needs: No Transportation Needs (04/28/2024)   Received from [facility] - Transportation    In the past 12 months, has lack of transportation kept you from medical appointments or from getting medications?: No    Lack of Transportation (Non-Medical): No  Physical Activity: Not on file  Stress: Not on file  Social Connections: Not on file  Intimate Partner Violence: Not on file  Depression (EYV7-0): Not  on file  Alcohol Screen: Not on file  Housing: Low Risk  (04/28/2024)   Received from [facility]   Epic    In the last 12 months, was there a time when you were not able to pay the mortgage or rent on time?: No    In the past 12 months, how many times have you moved where you were living?: 0    At any time in the past 12 months, were you homeless or living in a shelter (including now)?: No  Utilities: Not At Risk (04/28/2024)   Received from [facility]   Epic    In the past 12 months has the electric, gas, oil, or water company threatened to shut off services in your home?: No  Health Literacy: Not on file    History reviewed. No pertinent family history.    Review of systems complete and found to be negative unless listed above      PHYSICAL EXAM  General: Well developed, well nourished, in no acute distress HEENT:  Normocephalic and atramatic Neck:  No JVD.  Lungs: Clear bilaterally to auscultation and percussion. Heart: HRRR . Normal S1 and S2 without gallops or murmurs.  Abdomen: Bowel sounds are positive, abdomen soft and non-tender  Msk:  Back normal, normal gait. Normal strength and tone for age. Extremities: No clubbing, cyanosis or edema.   Neuro: Alert and oriented X 3. Psych:  Good affect, responds appropriately  Labs:   Lab Results  Component Value Date   WBC 7.1 10/11/2024   HGB 14.9 10/11/2024   HCT 45.3 10/11/2024   MCV 96.0 10/11/2024   PLT 189 10/11/2024    Recent Labs  Lab 10/11/24 0959  NA 139  K 4.8  CL 102  CO2 27  BUN 16  CREATININE 1.00  CALCIUM  9.2  PROT 6.9  BILITOT 1.3*  ALKPHOS 114  ALT 34  AST 38  GLUCOSE 113*   Lab Results  Component Value Date   CKTOTAL 92 01/22/2013   CKMB 1.1 01/23/2013   TROPONINI <0.03 07/11/2016    Lab Results  Component Value Date   CHOL 157 01/23/2013   Lab Results  Component Value Date   HDL 27 (L) 01/23/2013   Lab Results  Component Value Date    LDLCALC 96 01/23/2013   Lab Results  Component Value Date   TRIG 169 01/23/2013   No results found for: CHOLHDL No results found for: LDLDIRECT    Radiology: CT Angio Chest PE W and/or Wo Contrast Result Date: 10/11/2024 EXAM: CTA of the Chest with contrast for PE 10/11/2024 12:57:40 PM TECHNIQUE: CTA of the chest was performed after the administration of intravenous contrast. Multiplanar reformatted images are provided for review. MIP images are provided for review. Automated exposure control, iterative reconstruction, and/or weight based adjustment of the mA/kV was utilized to reduce  the radiation dose to as low as reasonably achievable. COMPARISON: Chest radiograph 10/11/2024 and CT chest 02/06/2024. CLINICAL HISTORY: Pulmonary embolism (PE) suspected, high prob. Worsening shortness of breath for 10 days. FINDINGS: PULMONARY ARTERIES: Moderate opacification of the central and segmental pulmonary arteries with mild motion artifact. No focal filling defects are identified. No evidence of significant pulmonary embolism. Main pulmonary artery is normal in caliber. MEDIASTINUM: Postoperative changes with aortic valve replacement, median sternotomy, and coronary artery bypass grafts. Normal heart size. No pericardial effusions. 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 The thyroid gland is unremarkable. The esophagus is decompressed. LYMPH NODES: The mediastinal lymph nodes are not pathologically enlarged, likely reactive. No hilar or axillary lymphadenopathy. LUNGS AND PLEURA: Motion artifact limits evaluation of the lungs. There is diffuse infiltration throughout the left lung base with focal areas of infiltration in the right lung base. This is likely multifocal pneumonia. There is a small left pleural effusion. No pneumothorax. UPPER ABDOMEN: Visualized upper abdominal contents are unremarkable. SOFT TISSUES AND BONES: No acute bone or soft tissue abnormality. IMPRESSION:  1. No evidence of significant pulmonary embolism. 2. Likely multifocal pneumonia. Motion artifact limits evaluation of the lungs. 3. Small left pleural effusion. 4. 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 Yearly follow-up is recommended. Electronically signed by: Elsie Gravely MD 10/11/2024 01:27 PM EST RP Workstation: [id number]   DG Chest Portable 1 View Result Date: 10/11/2024 CLINICAL DATA:  Shortness of breath. EXAM: PORTABLE CHEST 1 VIEW COMPARISON:  Chest radiograph dated 07/11/2016. FINDINGS: Cardiomegaly with vascular congestion. No focal consolidation or pleural effusion or pneumothorax. Median sternotomy wires and CABG vascular clips. No acute osseous pathology. IMPRESSION: Cardiomegaly with vascular congestion. No focal consolidation. Electronically Signed   By: Vanetta Chou M.D.   On: 10/11/2024 10:26    EKG: Normal sinus rhythm rate of 80 nonspecific ST-T wave changes  ASSESSMENT AND PLAN:  Acute congestive heart failure Coronary artery disease Coronary bypass surgery Obesity Hypertension Bicuspid aortic valve Hyperlipidemia Obstructive sleep apnea History of multiple PCI and stents Former smoker . Plan Acute on chronic diastolic congestive heart failure recommend diuresis follow-up troponins EKGs supplemental oxygen as necessary GDMT if able to tolerate Multivessel coronary artery disease denies any current angina off of metoprolol because of bradycardia still on aspirin  Plavix  and statin therapy Coronary bypass surgery reasonably stable no recent angina continue conservative therapy with aspirin  statin Plavix  Hypertension reasonably controlled recommend continue current therapy amlodipine  Imdur  metoprolol spironolactone  Obesity recommend weight loss exercise portion control HFpEF continue Farxiga  spironolactone  diuretics consider adding ARB Bradycardia recommend holding metoprolol therapy no indication for permanent  pacemaker Hyperlipidemia currently on high intensity statin and Zetia  continue lipid management LDL goal should be less than 55 Atrial fibrillation new onset rate controlled recommend anticoagulation with Eliquis  consider cardioversion consider referral to EP No clear indication for cardiac cath or any invasive procedure at this point  Signed: Cara JONETTA Lovelace MD 10/11/2024, 11:28 PM

## 2024-10-12 ENCOUNTER — Observation Stay
Admit: 2024-10-12 | Discharge: 2024-10-12 | Disposition: A | Payer: PRIVATE HEALTH INSURANCE | Attending: Internal Medicine

## 2024-10-12 ENCOUNTER — Other Ambulatory Visit (HOSPITAL_COMMUNITY): Payer: Self-pay

## 2024-10-12 ENCOUNTER — Telehealth (HOSPITAL_COMMUNITY): Payer: Self-pay

## 2024-10-12 DIAGNOSIS — T447X5A Adverse effect of beta-adrenoreceptor antagonists, initial encounter: Secondary | ICD-10-CM | POA: Diagnosis present

## 2024-10-12 DIAGNOSIS — I509 Heart failure, unspecified: Principal | ICD-10-CM

## 2024-10-12 DIAGNOSIS — I5031 Acute diastolic (congestive) heart failure: Secondary | ICD-10-CM | POA: Diagnosis not present

## 2024-10-12 DIAGNOSIS — I4892 Unspecified atrial flutter: Secondary | ICD-10-CM | POA: Diagnosis present

## 2024-10-12 DIAGNOSIS — Z79899 Other long term (current) drug therapy: Secondary | ICD-10-CM | POA: Diagnosis not present

## 2024-10-12 DIAGNOSIS — J9601 Acute respiratory failure with hypoxia: Secondary | ICD-10-CM | POA: Diagnosis present

## 2024-10-12 DIAGNOSIS — Z23 Encounter for immunization: Secondary | ICD-10-CM | POA: Diagnosis not present

## 2024-10-12 DIAGNOSIS — I77810 Thoracic aortic ectasia: Secondary | ICD-10-CM | POA: Diagnosis present

## 2024-10-12 DIAGNOSIS — E781 Pure hyperglyceridemia: Secondary | ICD-10-CM | POA: Diagnosis present

## 2024-10-12 DIAGNOSIS — J441 Chronic obstructive pulmonary disease with (acute) exacerbation: Secondary | ICD-10-CM | POA: Diagnosis present

## 2024-10-12 DIAGNOSIS — J189 Pneumonia, unspecified organism: Secondary | ICD-10-CM | POA: Diagnosis present

## 2024-10-12 DIAGNOSIS — Z952 Presence of prosthetic heart valve: Secondary | ICD-10-CM | POA: Diagnosis not present

## 2024-10-12 DIAGNOSIS — I2489 Other forms of acute ischemic heart disease: Secondary | ICD-10-CM | POA: Diagnosis present

## 2024-10-12 DIAGNOSIS — G4733 Obstructive sleep apnea (adult) (pediatric): Secondary | ICD-10-CM | POA: Diagnosis present

## 2024-10-12 DIAGNOSIS — R001 Bradycardia, unspecified: Secondary | ICD-10-CM | POA: Diagnosis present

## 2024-10-12 DIAGNOSIS — I5033 Acute on chronic diastolic (congestive) heart failure: Secondary | ICD-10-CM | POA: Diagnosis present

## 2024-10-12 DIAGNOSIS — I4891 Unspecified atrial fibrillation: Secondary | ICD-10-CM | POA: Diagnosis present

## 2024-10-12 DIAGNOSIS — Z7982 Long term (current) use of aspirin: Secondary | ICD-10-CM | POA: Diagnosis not present

## 2024-10-12 DIAGNOSIS — Z7902 Long term (current) use of antithrombotics/antiplatelets: Secondary | ICD-10-CM | POA: Diagnosis not present

## 2024-10-12 DIAGNOSIS — I110 Hypertensive heart disease with heart failure: Secondary | ICD-10-CM | POA: Diagnosis present

## 2024-10-12 DIAGNOSIS — I2510 Atherosclerotic heart disease of native coronary artery without angina pectoris: Secondary | ICD-10-CM | POA: Diagnosis present

## 2024-10-12 DIAGNOSIS — K589 Irritable bowel syndrome without diarrhea: Secondary | ICD-10-CM | POA: Diagnosis present

## 2024-10-12 DIAGNOSIS — Z1152 Encounter for screening for COVID-19: Secondary | ICD-10-CM | POA: Diagnosis not present

## 2024-10-12 DIAGNOSIS — Z7901 Long term (current) use of anticoagulants: Secondary | ICD-10-CM | POA: Diagnosis not present

## 2024-10-12 DIAGNOSIS — Z6841 Body Mass Index (BMI) 40.0 and over, adult: Secondary | ICD-10-CM | POA: Diagnosis not present

## 2024-10-12 LAB — RESPIRATORY PANEL BY PCR

## 2024-10-12 LAB — BASIC METABOLIC PANEL WITH GFR
Anion gap: 10 (ref 5–15)
BUN: 20 mg/dL (ref 6–20)
CO2: 30 mmol/L (ref 22–32)
Calcium: 8.8 mg/dL — ABNORMAL LOW (ref 8.9–10.3)
Chloride: 101 mmol/L (ref 98–111)
Creatinine, Ser: 1.16 mg/dL (ref 0.61–1.24)
GFR, Estimated: >60 mL/min (ref >60)
Glucose, Bld: 96 mg/dL (ref 70–99)
Potassium: 3.8 mmol/L (ref 3.5–5.1)
Sodium: 141 mmol/L (ref 135–145)

## 2024-10-12 LAB — HIV ANTIBODY (ROUTINE TESTING W REFLEX): HIV Screen 4th Generation wRfx: NONREACTIVE

## 2024-10-12 MED ORDER — APIXABAN 5 MG PO TABS
5.0000 mg | ORAL_TABLET | Freq: Two times a day (BID) | ORAL | Status: DC
  Administered 2024-10-12 – 2024-10-14 (×5): 5 mg via ORAL
  Filled 2024-10-12 (×5): qty 1

## 2024-10-12 MED ORDER — SPIRONOLACTONE 12.5 MG HALF TABLET
12.5000 mg | ORAL_TABLET | Freq: Every day | ORAL | Status: DC
  Administered 2024-10-12 – 2024-10-13 (×2): 12.5 mg via ORAL
  Filled 2024-10-12 (×4): qty 1

## 2024-10-12 NOTE — Progress Notes (Signed)
 Robert NOTE    Robert Haley  FMW:[id number] DOB: [birth year] DOA: 10/11/2024 PCP: Valora Lynwood FALCON, MD    Brief Narrative:  58 y.o. male with medical history significant of CAD status post CABG and stenting, HTN, HLD, OSA on CPAP at bedtime, presented with increasing shortness of breath.   Symptoms started about 2 weeks ago when patient started to have increasing exertional dyspnea, gradually getting worse, yesterday even walking into his home health close significant shortness of breath.  Denies any cough no chest pain.  No ankle edema.  He also admitted increasing orthopnea.  Denies any lightheadedness palpitations.  He has a history of multivessel CAD status post CABG and multiple stenting, followed with [facility]ology and had annual stress test May this year which showed normal stress test.  Denied any recent medication changes. ED Course: Heart rate ranging from 40-60, CTA negative for PE but signs of multifocal pneumonia versus pulmonary vascular congestion, WBC 7.1 hemoglobin 14.9, TSH 2.6, troponin 36> 40.     Assessment & Plan:   Principal Problem:   CHF (congestive heart failure) (HCC) Active Problems:   Acute on chronic diastolic CHF (congestive heart failure) (HCC)   Acute decompensated heart failure (HCC)    Acute CHF decompensation Symptomatic bradycardia Shortness of breath, BNP elevated.  Chest x-ray with possible edema. Plan: Continue IV Lasix  2D echocardiogram Hold metoprolol Spironolactone  12.5 daily  Acute hypoxic respiratory failure Likely in setting of new onset CHF 4 L Plan: Diuresis as tolerated Wean oxygen  New onset atrial fibrillation/flutter Currently rate controlled Apparently had a bradycardic reaction to metoprolol Plan: Telemetry Hold metoprolol Eliquis     CAD HTN Continue Imdur  and amlodipine  Hold metoprolol Continue aspirin  Plavix  Continue statin No plans for ischemic evaluation   Elevated troponins No significant delta.   Suspect supply/demand ischemia in the setting of decompensated CHF.  No plans for ischemic evaluation at this time.  Continue telemetry.   Morbid obesity BMI 50.  Complicates overall care and prognosis.      DVT prophylaxis: Eliquis  Code Status: Full Family Communication: None Disposition Plan: Status is: Inpatient Remains inpatient appropriate because: Acute decompensated CHF   Level of care: Telemetry  Consultants:  Cardiology-[facility]  Procedures:  None  Antimicrobials: None   Subjective: Seen and examined.  Sitting up on edge of bed.  Shortness of breath persist but improved from prior.  No complaints of chest pain.  Objective: Vitals:   10/12/24 1006 10/12/24 1200 10/12/24 1215 10/12/24 1314  BP: 102/66 (!) 114/56    Pulse:  68    Resp:  (!) 22 13   Temp:    98.2 F (36.8 C)  TempSrc:    Oral  SpO2:  90%    Weight:      Height:        Intake/Output Summary (Last 24 hours) at 10/12/2024 1415 Last data filed at 10/12/2024 1042 Gross per 24 hour  Intake --  Output 325 ml  Net -325 ml   Filed Weights   10/11/24 0918  Weight: (!) 149.7 kg    Examination:  General exam: Appears calm and comfortable  Respiratory system: Crackles bilaterally.  Equal air entry.  Normal work of breathing.  4 L Cardiovascular system: S1-S2, RRR, bradycardic, no murmurs, trace pedal edema Gastrointestinal system: Obese, soft, NT/ND, normal bowel sounds Central nervous system: Alert and oriented. No focal neurological deficits. Extremities: Symmetric 5 x 5 power. Skin: No rashes, lesions or ulcers Psychiatry: Judgement and insight appear normal. Mood &  affect appropriate.     Data Reviewed: I have personally reviewed following labs and imaging studies  CBC: Recent Labs  Lab 10/11/24 0959  WBC 7.1  NEUTROABS 5.6  HGB 14.9  HCT 45.3  MCV 96.0  PLT 189   Basic Metabolic Panel: Recent Labs  Lab 10/11/24 0959 10/12/24 0320  NA 139 141  K 4.8 3.8  CL  102 101  CO2 27 30  GLUCOSE 113* 96  BUN 16 20  CREATININE 1.00 1.16  CALCIUM  9.2 8.8*   GFR: Estimated Creatinine Clearance: 99.1 mL/min (by C-G formula based on SCr of 1.16 mg/dL). Liver Function Tests: Recent Labs  Lab 10/11/24 0959  AST 38  ALT 34  ALKPHOS 114  BILITOT 1.3*  PROT 6.9  ALBUMIN 4.2   No results for input(s): LIPASE, AMYLASE in the last 168 hours. No results for input(s): AMMONIA in the last 168 hours. Coagulation Profile: No results for input(s): INR, PROTIME in the last 168 hours. Cardiac Enzymes: No results for input(s): CKTOTAL, CKMB, CKMBINDEX, TROPONINI in the last 168 hours. BNP (last 3 results) Recent Labs    10/11/24 0959  PROBNP 2,025.0*   HbA1C: No results for input(s): HGBA1C in the last 72 hours. CBG: No results for input(s): GLUCAP in the last 168 hours. Lipid Profile: No results for input(s): CHOL, HDL, LDLCALC, TRIG, CHOLHDL, LDLDIRECT in the last 72 hours. Thyroid Function Tests: Recent Labs    10/11/24 1328  TSH 2.630  FREET4 1.12   Anemia Panel: No results for input(s): VITAMINB12, FOLATE, FERRITIN, TIBC, IRON, RETICCTPCT in the last 72 hours. Sepsis Labs: Recent Labs  Lab 10/11/24 1643  PROCALCITON <0.10    Recent Results (from the past 240 hours)  Resp panel by RT-PCR (RSV, Flu A&B, Covid) Anterior Nasal Swab     Status: None   Collection Time: 10/11/24  9:59 AM   Specimen: Anterior Nasal Swab  Result Value Ref Range Status   SARS Coronavirus 2 by RT PCR NEGATIVE NEGATIVE Final    Comment: (NOTE) SARS-CoV-2 target nucleic acids are NOT DETECTED.  The SARS-CoV-2 RNA is generally detectable in upper respiratory specimens during the acute phase of infection. The lowest concentration of SARS-CoV-2 viral copies this assay can detect is 138 copies/mL. A negative result does not preclude SARS-Cov-2 infection and should not be used as the sole basis for treatment or other  patient management decisions. A negative result may occur with  improper specimen collection/handling, submission of specimen other than nasopharyngeal swab, presence of viral mutation(s) within the areas targeted by this assay, and inadequate number of viral copies(<138 copies/mL). A negative result must be combined with clinical observations, patient history, and epidemiological information. The expected result is Negative.  Fact Sheet for Patients:  bloggercourse.com  Fact Sheet for Healthcare Providers:  seriousbroker.it  This test is no t yet approved or cleared by the [place]  FDA and  has been authorized for detection and/or diagnosis of SARS-CoV-2 by FDA under an Emergency Use Authorization (EUA). This EUA will remain  in effect (meaning this test can be used) for the duration of the COVID-19 declaration under Section 564(b)(1) of the Act, 21 U.S.C.section 360bbb-3(b)(1), unless the authorization is terminated  or revoked sooner.       Influenza A by PCR NEGATIVE NEGATIVE Final   Influenza B by PCR NEGATIVE NEGATIVE Final    Comment: (NOTE) The Xpert Xpress SARS-CoV-2/FLU/RSV plus assay is intended as an aid in the diagnosis of influenza from Nasopharyngeal swab specimens  and should not be used as a sole basis for treatment. Nasal washings and aspirates are unacceptable for Xpert Xpress SARS-CoV-2/FLU/RSV testing.  Fact Sheet for Patients: bloggercourse.com  Fact Sheet for Healthcare Providers: seriousbroker.it  This test is not yet approved or cleared by the [place]  FDA and has been authorized for detection and/or diagnosis of SARS-CoV-2 by FDA under an Emergency Use Authorization (EUA). This EUA will remain in effect (meaning this test can be used) for the duration of the COVID-19 declaration under Section 564(b)(1) of the Act, 21 U.S.C. section  360bbb-3(b)(1), unless the authorization is terminated or revoked.     Resp Syncytial Virus by PCR NEGATIVE NEGATIVE Final    Comment: (NOTE) Fact Sheet for Patients: bloggercourse.com  Fact Sheet for Healthcare Providers: seriousbroker.it  This test is not yet approved or cleared by the [place]  FDA and has been authorized for detection and/or diagnosis of SARS-CoV-2 by FDA under an Emergency Use Authorization (EUA). This EUA will remain in effect (meaning this test can be used) for the duration of the COVID-19 declaration under Section 564(b)(1) of the Act, 21 U.S.C. section 360bbb-3(b)(1), unless the authorization is terminated or revoked.  Performed at [facility], [address] [place], [place] [address]          Radiology Studies: CT Angio Chest PE W and/or Wo Contrast Result Date: 10/11/2024 EXAM: CTA of the Chest with contrast for PE 10/11/2024 12:57:40 PM TECHNIQUE: CTA of the chest was performed after the administration of intravenous contrast. Multiplanar reformatted images are provided for review. MIP images are provided for review. Automated exposure control, iterative reconstruction, and/or weight based adjustment of the mA/kV was utilized to reduce the radiation dose to as low as reasonably achievable. COMPARISON: Chest radiograph 10/11/2024 and CT chest 02/06/2024. CLINICAL HISTORY: Pulmonary embolism (PE) suspected, high prob. Worsening shortness of breath for 10 days. FINDINGS: PULMONARY ARTERIES: Moderate opacification of the central and segmental pulmonary arteries with mild motion artifact. No focal filling defects are identified. No evidence of significant pulmonary embolism. Main pulmonary artery is normal in caliber. MEDIASTINUM: Postoperative changes with aortic valve replacement, median sternotomy, and coronary artery bypass grafts. Normal heart size. No pericardial effusions. Aneurysm of the  ascending thoracic aorta measuring 5.2 cm in diameter. The thyroid gland is unremarkable. The esophagus is decompressed. LYMPH NODES: The mediastinal lymph nodes are not pathologically enlarged, likely reactive. No hilar or axillary lymphadenopathy. LUNGS AND PLEURA: Motion artifact limits evaluation of the lungs. There is diffuse infiltration throughout the left lung base with focal areas of infiltration in the right lung base. This is likely multifocal pneumonia. There is a small left pleural effusion. No pneumothorax. UPPER ABDOMEN: Visualized upper abdominal contents are unremarkable. SOFT TISSUES AND BONES: No acute bone or soft tissue abnormality. IMPRESSION: 1. No evidence of significant pulmonary embolism. 2. Likely multifocal pneumonia. Motion artifact limits evaluation of the lungs. 3. Small left pleural effusion. 4. 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 Yearly follow-up is recommended. Electronically signed by: Elsie Gravely MD 10/11/2024 01:27 PM EST RP Workstation: [id number]   DG Chest Portable 1 View Result Date: 10/11/2024 CLINICAL DATA:  Shortness of breath. EXAM: PORTABLE CHEST 1 VIEW COMPARISON:  Chest radiograph dated 07/11/2016. FINDINGS: Cardiomegaly with vascular congestion. No focal consolidation or pleural effusion or pneumothorax. Median sternotomy wires and CABG vascular clips. No acute osseous pathology. IMPRESSION: Cardiomegaly with vascular congestion. No focal consolidation. Electronically Signed   By: Vanetta Shelia HERO.D.  On: 10/11/2024 10:26        Scheduled Meds:  amLODipine   5 mg Oral Daily   apixaban   5 mg Oral BID   atorvastatin   80 mg Oral QPM   clopidogrel   75 mg Oral Daily   ezetimibe   10 mg Oral Daily   furosemide   40 mg Intravenous BID   isosorbide  mononitrate  30 mg Oral BID   sertraline   100 mg Oral Daily   sodium chloride  flush  3 mL Intravenous Q12H   spironolactone   12.5 mg Oral Daily   Continuous Infusions:   sodium chloride        LOS: 0 days      Calvin KATHEE Robson, MD [facility]   If 7PM-7AM, please contact night-coverage  10/12/2024, 2:15 PM

## 2024-10-12 NOTE — Telephone Encounter (Signed)
 Pharmacy Patient Advocate Encounter  Insurance verification completed.    The patient is insured through [facility] Patient has [employer], may use a copay card, and/or apply for patient assistance if available.    Ran test claim for Eliquis  5mg  tablet and the current 30 day co-pay is $25.   This test claim was processed through [employer]- copay amounts may vary at other pharmacies due to [employer], or as the patient moves through the different stages of their insurance plan.

## 2024-10-12 NOTE — ED Notes (Signed)
 Patient placed on BIPAP by respiratory, prefers the mask

## 2024-10-12 NOTE — ED Notes (Signed)
 At approximately [id number] while patient was asleep, patient went from NSR into a flutter, VSS, HR 80's, woke patient who denied any CP or SOB, took patient off CPAP and placed on 4LNC, O2 sat 98-99%.  EKG obtained and cardiology walked in during EKG to evaluate patient, EKG signed by cardiology.

## 2024-10-12 NOTE — Progress Notes (Signed)
 [facility] CLINIC CARDIOLOGY PROGRESS NOTE   Patient ID: Robert Haley MRN: [id number] DOB/AGE: Sep 08, 1966 58 y.o.  Admit date: 10/11/2024 Referring Physician Dr. Cort Mana Primary Physician Valora, Lynwood FALCON, MD  Primary Cardiologist Dr. Dwain Molt Reason for Consultation AoCHF  HPI: Robert Haley is a 58 y.o. male with a past medical history of CAD (01/2013 CABG, 03/2013 PCI of 3 VG BP, 03/2020 PCI of ISR of SVG-OM2), bicuspid AV/Severe AS s/p TAVR 02/2017, dilated aortic root, HTN and dyslipidemia who presented to the ED on 10/11/2024 for shortness of breath. Cardiology was consulted for further evaluation.   Interval History:  -Patient seen and examined this AM, sitting upright on side of hospital bed.  -Reports SOB mildly improved today. Denies LE edema.  -Denies any CP or other anginal symptoms.  -BP and HR stable. Was in AF/flutter overnight but now in NSR.   Review of systems complete and found to be negative unless listed above   Vitals:   10/12/24 0030 10/12/24 0400 10/12/24 0656 10/12/24 0809  BP: 116/78 107/70 (!) 99/50 110/67  Pulse: 75 (!) 58 (!) 58 70  Resp: 18 13 17 15   Temp: 98.7 F (37.1 C) 98.5 F (36.9 C) 98.2 F (36.8 C) 98.8 F (37.1 C)  TempSrc: Oral Oral Oral Oral  SpO2: 97% 99% 97% 95%  Weight:      Height:        No intake or output data in the 24 hours ending 10/12/24 0926   PHYSICAL EXAM General: Well appearing male, well nourished, in no acute distress. HEENT: Normocephalic and atraumatic. Neck: No JVD.  Lungs: Normal respiratory effort on 3L [place]. Clear bilaterally to auscultation. No wheezes, crackles, rhonchi.  Heart: HRRR. Normal S1 and S2 without gallops or murmurs. Radial & DP pulses 2+ bilaterally. Abdomen: Non-distended appearing.  Msk: Normal strength and tone for age. Extremities: No clubbing, cyanosis or edema.   Neuro: Alert and oriented X 3. Psych: Mood appropriate, affect congruent.    LABS: Basic Metabolic Panel: Recent Labs     10/11/24 0959 10/12/24 0320  NA 139 141  K 4.8 3.8  CL 102 101  CO2 27 30  GLUCOSE 113* 96  BUN 16 20  CREATININE 1.00 1.16  CALCIUM  9.2 8.8*   Liver Function Tests: Recent Labs    10/11/24 0959  AST 38  ALT 34  ALKPHOS 114  BILITOT 1.3*  PROT 6.9  ALBUMIN 4.2   No results for input(s): LIPASE, AMYLASE in the last 72 hours. CBC: Recent Labs    10/11/24 0959  WBC 7.1  NEUTROABS 5.6  HGB 14.9  HCT 45.3  MCV 96.0  PLT 189   Cardiac Enzymes: No results for input(s): CKTOTAL, CKMB, CKMBINDEX, TROPONINIHS in the last 72 hours. BNP: No results for input(s): BNP in the last 72 hours. D-Dimer: No results for input(s): DDIMER in the last 72 hours. Hemoglobin A1C: No results for input(s): HGBA1C in the last 72 hours. Fasting Lipid Panel: No results for input(s): CHOL, HDL, LDLCALC, TRIG, CHOLHDL, LDLDIRECT in the last 72 hours. Thyroid Function Tests: Recent Labs    10/11/24 1328  TSH 2.630   Anemia Panel: No results for input(s): VITAMINB12, FOLATE, FERRITIN, TIBC, IRON, RETICCTPCT in the last 72 hours.  CT Angio Chest PE W and/or Wo Contrast Result Date: 10/11/2024 EXAM: CTA of the Chest with contrast for PE 10/11/2024 12:57:40 PM TECHNIQUE: CTA of the chest was performed after the administration of intravenous contrast. Multiplanar reformatted images are  provided for review. MIP images are provided for review. Automated exposure control, iterative reconstruction, and/or weight based adjustment of the mA/kV was utilized to reduce the radiation dose to as low as reasonably achievable. COMPARISON: Chest radiograph 10/11/2024 and CT chest 02/06/2024. CLINICAL HISTORY: Pulmonary embolism (PE) suspected, high prob. Worsening shortness of breath for 10 days. FINDINGS: PULMONARY ARTERIES: Moderate opacification of the central and segmental pulmonary arteries with mild motion artifact. No focal filling defects are identified. No  evidence of significant pulmonary embolism. Main pulmonary artery is normal in caliber. MEDIASTINUM: Postoperative changes with aortic valve replacement, median sternotomy, and coronary artery bypass grafts. Normal heart size. No pericardial effusions. 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 The thyroid gland is unremarkable. The esophagus is decompressed. LYMPH NODES: The mediastinal lymph nodes are not pathologically enlarged, likely reactive. No hilar or axillary lymphadenopathy. LUNGS AND PLEURA: Motion artifact limits evaluation of the lungs. There is diffuse infiltration throughout the left lung base with focal areas of infiltration in the right lung base. This is likely multifocal pneumonia. There is a small left pleural effusion. No pneumothorax. UPPER ABDOMEN: Visualized upper abdominal contents are unremarkable. SOFT TISSUES AND BONES: No acute bone or soft tissue abnormality. IMPRESSION: 1. No evidence of significant pulmonary embolism. 2. Likely multifocal pneumonia. Motion artifact limits evaluation of the lungs. 3. Small left pleural effusion. 4. 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 Yearly follow-up is recommended. Electronically signed by: Elsie Gravely MD 10/11/2024 01:27 PM EST RP Workstation: [id number]   DG Chest Portable 1 View Result Date: 10/11/2024 CLINICAL DATA:  Shortness of breath. EXAM: PORTABLE CHEST 1 VIEW COMPARISON:  Chest radiograph dated 07/11/2016. FINDINGS: Cardiomegaly with vascular congestion. No focal consolidation or pleural effusion or pneumothorax. Median sternotomy wires and CABG vascular clips. No acute osseous pathology. IMPRESSION: Cardiomegaly with vascular congestion. No focal consolidation. Electronically Signed   By: Vanetta Chou M.D.   On: 10/11/2024 10:26     ECHO pending  TELEMETRY (personally reviewed): NSR rate 70s, AF/AFL overnight  EKG (personally reviewed): NSR rate 80  bpm  DATA reviewed by me 10/12/2024: last 24h vitals tele labs imaging I/O, hospitalist progress note  Principal Problem:   CHF (congestive heart failure) (HCC) Active Problems:   Acute on chronic diastolic CHF (congestive heart failure) (HCC)   Acute decompensated heart failure (HCC)    ASSESSMENT AND PLAN: INAKI VANTINE is a 58 y.o. male with a past medical history of CAD (01/2013 CABG, 03/2013 PCI of 3 VG BP, 03/2020 PCI of ISR of SVG-OM2), bicuspid AV/Severe AS s/p TAVR 02/2017, dilated aortic root, HTN and dyslipidemia who presented to the ED on 10/11/2024 for shortness of breath. Cardiology was consulted for further evaluation.   # Acute on chronic HFpEF # New onset atrial fibrillation/flutter # Coronary artery disease # Hx TAVR 2018 # Hypertension Patient presented with complaints of SOB, BNP elevated at 2000 and CXR with possible edema. Trops mild and flat. Overnight noted to be in AF/AFL which he has no hx of.  -Echo ordered, further recommendations pending these results.  -Start spironolactone  12.5 mg daily. Further additions to GDMT pending BP, renal function.  -Continue IV lasix  40 mg twice daily. Home metoprolol held due to bradycardia.  -Start eliquis  5 mg twice daily for stroke risk reduction. Continue plavix  75 mg daily.  -Continue atorvastatin  80 mg daily and zetia  10 mg daily.  -Continue imdur  30 mg BID, amlodipine  5 mg daily.  This patient's case was discussed and created with Dr. Florencio and he is in agreement.  Signed:  Danita Bloch, PA-C  10/12/2024, 9:26 AM [facility]

## 2024-10-13 DIAGNOSIS — I5031 Acute diastolic (congestive) heart failure: Secondary | ICD-10-CM | POA: Diagnosis not present

## 2024-10-13 LAB — BASIC METABOLIC PANEL WITH GFR
Anion gap: 9 (ref 5–15)
BUN: 24 mg/dL — ABNORMAL HIGH (ref 6–20)
CO2: 32 mmol/L (ref 22–32)
Calcium: 8.8 mg/dL — ABNORMAL LOW (ref 8.9–10.3)
Chloride: 96 mmol/L — ABNORMAL LOW (ref 98–111)
Creatinine, Ser: 1.17 mg/dL (ref 0.61–1.24)
GFR, Estimated: >60 mL/min (ref >60)
Glucose, Bld: 101 mg/dL — ABNORMAL HIGH (ref 70–99)
Potassium: 3.7 mmol/L (ref 3.5–5.1)
Sodium: 138 mmol/L (ref 135–145)

## 2024-10-13 LAB — ECHOCARDIOGRAM COMPLETE
Area-P 1/2: 5.75 cm2
Height: 68 in
Weight: 5280 [oz_av]

## 2024-10-13 MED ORDER — SERTRALINE HCL 50 MG PO TABS
100.0000 mg | ORAL_TABLET | Freq: Once | ORAL | Status: AC
  Administered 2024-10-13: 11:00:00 100 mg via ORAL
  Filled 2024-10-13: qty 2

## 2024-10-13 MED ORDER — SERTRALINE HCL 50 MG PO TABS
200.0000 mg | ORAL_TABLET | Freq: Every day | ORAL | Status: DC
  Administered 2024-10-14: 08:00:00 200 mg via ORAL
  Filled 2024-10-13: qty 4

## 2024-10-13 MED ORDER — DAPAGLIFLOZIN PROPANEDIOL 10 MG PO TABS
10.0000 mg | ORAL_TABLET | Freq: Every day | ORAL | Status: DC
  Administered 2024-10-13 – 2024-10-14 (×2): 10 mg via ORAL
  Filled 2024-10-13 (×2): qty 1

## 2024-10-13 NOTE — ED Notes (Signed)
 Pt takes 200mg  sertraline  not 100mg , messaged pharmacy and provider.

## 2024-10-13 NOTE — ED Notes (Signed)
 After speaking with the provider and the pt, the pt has agreed to remain in this hospital and not be transferred to [facility].

## 2024-10-13 NOTE — ED Notes (Signed)
 Meal given

## 2024-10-13 NOTE — ED Notes (Signed)
 Pt called out and requested to speak to the provider regarding transfer to [facility]. PT advised that his cardiologist at [facility] was supposed to be notified of him being here and he was not notified. Pt's provider made aware of same and spoke with pt on the phone about it at this time.

## 2024-10-13 NOTE — Progress Notes (Signed)
 PROGRESS NOTE    Robert Haley  FMW:[id number] DOB: 05-10-1966 DOA: 10/11/2024 PCP: Valora Lynwood FALCON, MD    Brief Narrative:  58 y.o. male with medical history significant of CAD status post CABG and stenting, HTN, HLD, OSA on CPAP at bedtime, presented with increasing shortness of breath.   Symptoms started about 2 weeks ago when patient started to have increasing exertional dyspnea, gradually getting worse, yesterday even walking into his home health close significant shortness of breath.  Denies any cough no chest pain.  No ankle edema.  He also admitted increasing orthopnea.  Denies any lightheadedness palpitations.  He has a history of multivessel CAD status post CABG and multiple stenting, followed with [facility] and had annual stress test May this year which showed normal stress test.  Denied any recent medication changes. ED Course: Heart rate ranging from 40-60, CTA negative for PE but signs of multifocal pneumonia versus pulmonary vascular congestion, WBC 7.1 hemoglobin 14.9, TSH 2.6, troponin 36> 40.     Assessment & Plan:   Principal Problem:   CHF (congestive heart failure) (HCC) Active Problems:   Acute on chronic diastolic CHF (congestive heart failure) (HCC)   Acute decompensated heart failure (HCC)    Acute CHF decompensation Symptomatic bradycardia Shortness of breath, BNP elevated.  Chest x-ray with possible edema. Plan: Continue IV Lasix  2D echocardiogram, pending Hold metoprolol Continue spironolactone  12.5 daily  Acute hypoxic respiratory failure Likely in setting of new onset CHF Currently weaned down to 1 to 2 L l Plan: Continue diuresis as tolerated Wean oxygen  New onset atrial fibrillation/flutter Currently rate controlled Apparently had a bradycardic reaction to metoprolol Plan: Continue telemetry Hold metoprolol Continue Eliquis     CAD HTN Continue Imdur  and amlodipine  Hold metoprolol Continue aspirin  Plavix  Continue statin No  plans for ischemic evaluation at this time   Elevated troponins No significant delta.  Suspect supply/demand ischemia in the setting of decompensated CHF.  No plans for ischemic evaluation at this time.  Continue telemetry.   Morbid obesity BMI 50.  Complicates overall care and prognosis.      DVT prophylaxis: Eliquis  Code Status: Full Family Communication: None Disposition Plan: Status is: Inpatient Remains inpatient appropriate because: Acute decompensated CHF   Level of care: Telemetry  Consultants:  Cardiology-[facility]  Procedures:  None  Antimicrobials: None   Subjective: Seen and examined.  Sitting up on edge of bed.  Shortness of breath persistent but improved from prior.  Weaned down to 1.5 L at time of my evaluation.  Objective: Vitals:   10/13/24 0845 10/13/24 0900 10/13/24 0905 10/13/24 1118  BP:  120/61 120/61   Pulse: 61 (!) 58 (!) 58   Resp:   (!) 22   Temp:    98.3 F (36.8 C)  TempSrc:    Oral  SpO2: 94%  95%   Weight:      Height:        Intake/Output Summary (Last 24 hours) at 10/13/2024 1133 Last data filed at 10/13/2024 1003 Gross per 24 hour  Intake --  Output 1500 ml  Net -1500 ml   Filed Weights   10/11/24 0918  Weight: (!) 149.7 kg    Examination:  General exam: NAD.  Appears fatigued Respiratory system: Crackles bilaterally at bases.  Equal air entry.  Normal work of breathing.  1.5 L Cardiovascular system: S1-S2, RRR, bradycardic, no murmurs, trace pedal edema Gastrointestinal system: Obese, soft, NT/ND, normal bowel sounds Central nervous system: Alert and oriented. No  focal neurological deficits. Extremities: Symmetric 5 x 5 power. Skin: No rashes, lesions or ulcers Psychiatry: Judgement and insight appear normal. Mood & affect appropriate.     Data Reviewed: I have personally reviewed following labs and imaging studies  CBC: Recent Labs  Lab 10/11/24 0959  WBC 7.1  NEUTROABS 5.6  HGB 14.9  HCT 45.3   MCV 96.0  PLT 189   Basic Metabolic Panel: Recent Labs  Lab 10/11/24 0959 10/12/24 0320 10/13/24 0219  NA 139 141 138  K 4.8 3.8 3.7  CL 102 101 96*  CO2 27 30 32  GLUCOSE 113* 96 101*  BUN 16 20 24*  CREATININE 1.00 1.16 1.17  CALCIUM  9.2 8.8* 8.8*   GFR: Estimated Creatinine Clearance: 98.2 mL/min (by C-G formula based on SCr of 1.17 mg/dL). Liver Function Tests: Recent Labs  Lab 10/11/24 0959  AST 38  ALT 34  ALKPHOS 114  BILITOT 1.3*  PROT 6.9  ALBUMIN 4.2   No results for input(s): LIPASE, AMYLASE in the last 168 hours. No results for input(s): AMMONIA in the last 168 hours. Coagulation Profile: No results for input(s): INR, PROTIME in the last 168 hours. Cardiac Enzymes: No results for input(s): CKTOTAL, CKMB, CKMBINDEX, TROPONINI in the last 168 hours. BNP (last 3 results) Recent Labs    10/11/24 0959  PROBNP 2,025.0*   HbA1C: No results for input(s): HGBA1C in the last 72 hours. CBG: No results for input(s): GLUCAP in the last 168 hours. Lipid Profile: No results for input(s): CHOL, HDL, LDLCALC, TRIG, CHOLHDL, LDLDIRECT in the last 72 hours. Thyroid Function Tests: Recent Labs    10/11/24 1328  TSH 2.630  FREET4 1.12   Anemia Panel: No results for input(s): VITAMINB12, FOLATE, FERRITIN, TIBC, IRON, RETICCTPCT in the last 72 hours. Sepsis Labs: Recent Labs  Lab 10/11/24 1643  PROCALCITON <0.10    Recent Results (from the past 240 hours)  Resp panel by RT-PCR (RSV, Flu A&B, Covid) Anterior Nasal Swab     Status: None   Collection Time: 10/11/24  9:59 AM   Specimen: Anterior Nasal Swab  Result Value Ref Range Status   SARS Coronavirus 2 by RT PCR NEGATIVE NEGATIVE Final    Comment: (NOTE) SARS-CoV-2 target nucleic acids are NOT DETECTED.  The SARS-CoV-2 RNA is generally detectable in upper respiratory specimens during the acute phase of infection. The lowest concentration of SARS-CoV-2  viral copies this assay can detect is 138 copies/mL. A negative result does not preclude SARS-Cov-2 infection and should not be used as the sole basis for treatment or other patient management decisions. A negative result may occur with  improper specimen collection/handling, submission of specimen other than nasopharyngeal swab, presence of viral mutation(s) within the areas targeted by this assay, and inadequate number of viral copies(<138 copies/mL). A negative result must be combined with clinical observations, patient history, and epidemiological information. The expected result is Negative.  Fact Sheet for Patients:  bloggercourse.com  Fact Sheet for Healthcare Providers:  seriousbroker.it  This test is no t yet approved or cleared by the [place]  FDA and  has been authorized for detection and/or diagnosis of SARS-CoV-2 by FDA under an Emergency Use Authorization (EUA). This EUA will remain  in effect (meaning this test can be used) for the duration of the COVID-19 declaration under Section 564(b)(1) of the Act, 21 U.S.C.section 360bbb-3(b)(1), unless the authorization is terminated  or revoked sooner.       Influenza A by PCR NEGATIVE NEGATIVE Final  Influenza B by PCR NEGATIVE NEGATIVE Final    Comment: (NOTE) The Xpert Xpress SARS-CoV-2/FLU/RSV plus assay is intended as an aid in the diagnosis of influenza from Nasopharyngeal swab specimens and should not be used as a sole basis for treatment. Nasal washings and aspirates are unacceptable for Xpert Xpress SARS-CoV-2/FLU/RSV testing.  Fact Sheet for Patients: bloggercourse.com  Fact Sheet for Healthcare Providers: seriousbroker.it  This test is not yet approved or cleared by the [place]  FDA and has been authorized for detection and/or diagnosis of SARS-CoV-2 by FDA under an Emergency Use Authorization  (EUA). This EUA will remain in effect (meaning this test can be used) for the duration of the COVID-19 declaration under Section 564(b)(1) of the Act, 21 U.S.C. section 360bbb-3(b)(1), unless the authorization is terminated or revoked.     Resp Syncytial Virus by PCR NEGATIVE NEGATIVE Final    Comment: (NOTE) Fact Sheet for Patients: bloggercourse.com  Fact Sheet for Healthcare Providers: seriousbroker.it  This test is not yet approved or cleared by the [place]  FDA and has been authorized for detection and/or diagnosis of SARS-CoV-2 by FDA under an Emergency Use Authorization (EUA). This EUA will remain in effect (meaning this test can be used) for the duration of the COVID-19 declaration under Section 564(b)(1) of the Act, 21 U.S.C. section 360bbb-3(b)(1), unless the authorization is terminated or revoked.  Performed at [facility], [address] [place], [place] [address]   Respiratory (~20 pathogens) panel by PCR     Status: None   Collection Time: 10/12/24  8:16 AM   Specimen: Nasopharyngeal Swab; Respiratory  Result Value Ref Range Status   Adenovirus NOT DETECTED NOT DETECTED Final   Coronavirus 229E NOT DETECTED NOT DETECTED Final    Comment: (NOTE) The Coronavirus on the Respiratory Panel, DOES NOT test for the novel  Coronavirus (2019 nCoV)    Coronavirus HKU1 NOT DETECTED NOT DETECTED Final   Coronavirus NL63 NOT DETECTED NOT DETECTED Final   Coronavirus OC43 NOT DETECTED NOT DETECTED Final   Metapneumovirus NOT DETECTED NOT DETECTED Final   Rhinovirus / Enterovirus NOT DETECTED NOT DETECTED Final   Influenza A NOT DETECTED NOT DETECTED Final   Influenza B NOT DETECTED NOT DETECTED Final   Parainfluenza Virus 1 NOT DETECTED NOT DETECTED Final   Parainfluenza Virus 2 NOT DETECTED NOT DETECTED Final   Parainfluenza Virus 3 NOT DETECTED NOT DETECTED Final   Parainfluenza Virus 4 NOT DETECTED NOT  DETECTED Final   Respiratory Syncytial Virus NOT DETECTED NOT DETECTED Final   Bordetella pertussis NOT DETECTED NOT DETECTED Final   Bordetella Parapertussis NOT DETECTED NOT DETECTED Final   Chlamydophila pneumoniae NOT DETECTED NOT DETECTED Final   Mycoplasma pneumoniae NOT DETECTED NOT DETECTED Final    Comment: Performed at [facility], [address], [place], [place] [address]         Radiology Studies: ECHOCARDIOGRAM COMPLETE Result Date: 10/13/2024    ECHOCARDIOGRAM REPORT   Patient Name:   Robert Haley Date of Exam: 10/12/2024 Medical Rec #:  [id number]    Height:       68.0 in Accession #:    [id number]   Weight:       330.0 lb Date of Birth:  [birth year]    BSA:          2.528 m Patient Age:    58 years     BP:           99/50 mmHg Patient Gender: M  HR:           58 bpm. Exam Location:  [facility] Procedure: 2D Echo, Cardiac Doppler and Color Doppler (Both Spectral and Color            Flow Doppler were utilized during procedure). Indications:     CHF  History:         Patient has no prior history of Echocardiogram examinations.                  CHF, CAD, Aortic Valve Disease, Allergies:Definity; Risk                  Factors:Sleep Apnea and Dyslipidemia.                  Aortic Valve: valve is present in the aortic position.                  Procedure Date: 5/22/218.  Sonographer:     Philomena Daring Referring Phys:  [id number] CORT ONEIDA MANA Diagnosing Phys: Cara JONETTA Lovelace MD  Sonographer Comments: Technically challenging study due to limited acoustic windows, Technically difficult study due to poor echo windows and patient is obese. Definity not used due to patient allergy. IMPRESSIONS  1. Technically difficult study.  2. Left ventricular ejection fraction, by estimation, is 50 to 55%. The left ventricle has low normal function. The left ventricle has no regional wall motion abnormalities. Left ventricular diastolic function could not be evaluated.  3. Right ventricular systolic  function is normal. The right ventricular size is normal.  4. The mitral valve was not well visualized. Mild mitral valve regurgitation.  5. The aortic valve was not well visualized. Aortic valve regurgitation is not visualized. There is a valve present in the aortic position. Procedure Date: 5/22/218. Conclusion(s)/Recommendation(s): Poor windows for evaluation of left ventricular function by transthoracic echocardiography. Would recommend an alternative means of evaluation. FINDINGS  Left Ventricle: Left ventricular ejection fraction, by estimation, is 50 to 55%. The left ventricle has low normal function. The left ventricle has no regional wall motion abnormalities. Strain was performed and the global longitudinal strain is indeterminate. The left ventricular internal cavity size was normal in size. There is borderline left ventricular hypertrophy. Left ventricular diastolic function could not be evaluated. Right Ventricle: The right ventricular size is normal. No increase in right ventricular wall thickness. Right ventricular systolic function is normal. Left Atrium: Left atrial size was not well visualized. Right Atrium: Right atrial size was not well visualized. Pericardium: There is no evidence of pericardial effusion. Mitral Valve: The mitral valve was not well visualized. Mild mitral valve regurgitation. Tricuspid Valve: The tricuspid valve is normal in structure. Tricuspid valve regurgitation is mild. Aortic Valve: The aortic valve was not well visualized. Aortic valve regurgitation is not visualized. There is a valve present in the aortic position. Procedure Date: 5/22/218. Pulmonic Valve: The pulmonic valve was not well visualized. Pulmonic valve regurgitation is not visualized. Aorta: Aortic root could not be assessed. IAS/Shunts: The interatrial septum was not well visualized. Additional Comments: Technically difficult study. 3D was performed not requiring image post processing on an independent  workstation and was indeterminate.   Diastology LV e' medial:    6.74 cm/s LV E/e' medial:  21.1 LV e' lateral:   9.03 cm/s LV E/e' lateral: 15.7  RIGHT VENTRICLE            IVC RV S prime:     9.90 cm/s  IVC diam: 2.20 cm  TAPSE (M-mode): 1.7 cm LEFT ATRIUM             Index        RIGHT ATRIUM           Index LA Vol (A2C):   61.3 ml 24.24 ml/m  RA Area:     18.90 cm LA Vol (A4C):   53.1 ml 21.00 ml/m  RA Volume:   46.80 ml  18.51 ml/m LA Biplane Vol: 58.0 ml 22.94 ml/m  AORTIC VALVE LVOT Vmax:   136.00 cm/s LVOT Vmean:  93.250 cm/s LVOT VTI:    0.309 m MITRAL VALVE MV Area (PHT): 5.75 cm     SHUNTS MV Decel Time: 132 msec     Systemic VTI: 0.31 m MV E velocity: 142.00 cm/s Cara JONETTA Lovelace MD Electronically signed by Cara JONETTA Lovelace MD Signature Date/Time: 10/13/2024/9:45:07 AM    Final    CT Angio Chest PE W and/or Wo Contrast Result Date: 10/11/2024 EXAM: CTA of the Chest with contrast for PE 10/11/2024 12:57:40 PM TECHNIQUE: CTA of the chest was performed after the administration of intravenous contrast. Multiplanar reformatted images are provided for review. MIP images are provided for review. Automated exposure control, iterative reconstruction, and/or weight based adjustment of the mA/kV was utilized to reduce the radiation dose to as low as reasonably achievable. COMPARISON: Chest radiograph 10/11/2024 and CT chest 02/06/2024. CLINICAL HISTORY: Pulmonary embolism (PE) suspected, high prob. Worsening shortness of breath for 10 days. FINDINGS: PULMONARY ARTERIES: Moderate opacification of the central and segmental pulmonary arteries with mild motion artifact. No focal filling defects are identified. No evidence of significant pulmonary embolism. Main pulmonary artery is normal in caliber. MEDIASTINUM: Postoperative changes with aortic valve replacement, median sternotomy, and coronary artery bypass grafts. Normal heart size. No pericardial effusions. Aneurysm of the ascending thoracic aorta  measuring 5.2 cm in diameter. The thyroid gland is unremarkable. The esophagus is decompressed. LYMPH NODES: The mediastinal lymph nodes are not pathologically enlarged, likely reactive. No hilar or axillary lymphadenopathy. LUNGS AND PLEURA: Motion artifact limits evaluation of the lungs. There is diffuse infiltration throughout the left lung base with focal areas of infiltration in the right lung base. This is likely multifocal pneumonia. There is a small left pleural effusion. No pneumothorax. UPPER ABDOMEN: Visualized upper abdominal contents are unremarkable. SOFT TISSUES AND BONES: No acute bone or soft tissue abnormality. IMPRESSION: 1. No evidence of significant pulmonary embolism. 2. Likely multifocal pneumonia. Motion artifact limits evaluation of the lungs. 3. Small left pleural effusion. 4. 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 Yearly follow-up is recommended. Electronically signed by: Elsie Gravely MD 10/11/2024 01:27 PM EST RP Workstation: [id number]        Scheduled Meds:  amLODipine   5 mg Oral Daily   apixaban   5 mg Oral BID   atorvastatin   80 mg Oral QPM   clopidogrel   75 mg Oral Daily   dapagliflozin  propanediol  10 mg Oral Daily   ezetimibe   10 mg Oral Daily   furosemide   40 mg Intravenous BID   isosorbide  mononitrate  30 mg Oral BID   [START ON 10/14/2024] sertraline   200 mg Oral Daily   sodium chloride  flush  3 mL Intravenous Q12H   spironolactone   12.5 mg Oral Daily   Continuous Infusions:     LOS: 1 day      Calvin KATHEE Robson, MD [facility]   If 7PM-7AM, please contact night-coverage  10/13/2024, 11:33 AM

## 2024-10-13 NOTE — Progress Notes (Cosign Needed Addendum)
 [facility] CLINIC CARDIOLOGY PROGRESS NOTE   Patient ID: Robert Haley MRN: [id number] DOB/AGE: November 13, 1965 58 y.o.  Admit date: 10/11/2024 Referring Physician Dr. Cort Mana Primary Physician Valora, Lynwood FALCON, MD  Primary Cardiologist Dr. Dwain Molt Reason for Consultation AoCHF  HPI: Robert Haley is a 58 y.o. male with a past medical history of CAD (01/2013 CABG, 03/2013 PCI of 3 VG BP, 03/2020 PCI of ISR of SVG-OM2), bicuspid AV/Severe AS s/p TAVR 02/2017, dilated aortic root, HTN and dyslipidemia who presented to the ED on 10/11/2024 for shortness of breath. Cardiology was consulted for further evaluation.   Interval History:  -Patient seen and examined this AM, sitting upright on side of hospital bed.  -Feels SOB is somewhat improved, remains on supplemental O2.  -Denies any CP or other anginal symptoms.  -Intermittent AFL on tele.   Review of systems complete and found to be negative unless listed above   Vitals:   10/13/24 0531 10/13/24 0542 10/13/24 0742 10/13/24 0840  BP:  121/87    Pulse:  61  (!) 59  Resp:  19    Temp: 98.5 F (36.9 C) 98.5 F (36.9 C) 98.5 F (36.9 C)   TempSrc: Oral Oral Oral   SpO2:  100%  94%  Weight:      Height:         Intake/Output Summary (Last 24 hours) at 10/13/2024 0848 Last data filed at 10/12/2024 2251 Gross per 24 hour  Intake --  Output 1525 ml  Net -1525 ml     PHYSICAL EXAM General: Well appearing male, well nourished, in no acute distress. HEENT: Normocephalic and atraumatic. Neck: No JVD.  Lungs: Normal respiratory effort on 3L [place]. Clear bilaterally to auscultation. No wheezes, crackles, rhonchi.  Heart: HRRR. Normal S1 and S2 without gallops or murmurs. Radial & DP pulses 2+ bilaterally. Abdomen: Non-distended appearing.  Msk: Normal strength and tone for age. Extremities: No clubbing, cyanosis or edema.   Neuro: Alert and oriented X 3. Psych: Mood appropriate, affect congruent.    LABS: Basic Metabolic  Panel: Recent Labs    10/12/24 0320 10/13/24 0219  NA 141 138  K 3.8 3.7  CL 101 96*  CO2 30 32  GLUCOSE 96 101*  BUN 20 24*  CREATININE 1.16 1.17  CALCIUM  8.8* 8.8*   Liver Function Tests: Recent Labs    10/11/24 0959  AST 38  ALT 34  ALKPHOS 114  BILITOT 1.3*  PROT 6.9  ALBUMIN 4.2   No results for input(s): LIPASE, AMYLASE in the last 72 hours. CBC: Recent Labs    10/11/24 0959  WBC 7.1  NEUTROABS 5.6  HGB 14.9  HCT 45.3  MCV 96.0  PLT 189   Cardiac Enzymes: No results for input(s): CKTOTAL, CKMB, CKMBINDEX, TROPONINIHS in the last 72 hours. BNP: No results for input(s): BNP in the last 72 hours. D-Dimer: No results for input(s): DDIMER in the last 72 hours. Hemoglobin A1C: No results for input(s): HGBA1C in the last 72 hours. Fasting Lipid Panel: No results for input(s): CHOL, HDL, LDLCALC, TRIG, CHOLHDL, LDLDIRECT in the last 72 hours. Thyroid Function Tests: Recent Labs    10/11/24 1328  TSH 2.630   Anemia Panel: No results for input(s): VITAMINB12, FOLATE, FERRITIN, TIBC, IRON, RETICCTPCT in the last 72 hours.  CT Angio Chest PE W and/or Wo Contrast Result Date: 10/11/2024 EXAM: CTA of the Chest with contrast for PE 10/11/2024 12:57:40 PM TECHNIQUE: CTA of the chest was performed after the  administration of intravenous contrast. Multiplanar reformatted images are provided for review. MIP images are provided for review. Automated exposure control, iterative reconstruction, and/or weight based adjustment of the mA/kV was utilized to reduce the radiation dose to as low as reasonably achievable. COMPARISON: Chest radiograph 10/11/2024 and CT chest 02/06/2024. CLINICAL HISTORY: Pulmonary embolism (PE) suspected, high prob. Worsening shortness of breath for 10 days. FINDINGS: PULMONARY ARTERIES: Moderate opacification of the central and segmental pulmonary arteries with mild motion artifact. No focal filling defects  are identified. No evidence of significant pulmonary embolism. Main pulmonary artery is normal in caliber. MEDIASTINUM: Postoperative changes with aortic valve replacement, median sternotomy, and coronary artery bypass grafts. Normal heart size. No pericardial effusions. 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 The thyroid gland is unremarkable. The esophagus is decompressed. LYMPH NODES: The mediastinal lymph nodes are not pathologically enlarged, likely reactive. No hilar or axillary lymphadenopathy. LUNGS AND PLEURA: Motion artifact limits evaluation of the lungs. There is diffuse infiltration throughout the left lung base with focal areas of infiltration in the right lung base. This is likely multifocal pneumonia. There is a small left pleural effusion. No pneumothorax. UPPER ABDOMEN: Visualized upper abdominal contents are unremarkable. SOFT TISSUES AND BONES: No acute bone or soft tissue abnormality. IMPRESSION: 1. No evidence of significant pulmonary embolism. 2. Likely multifocal pneumonia. Motion artifact limits evaluation of the lungs. 3. Small left pleural effusion. 4. 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 Yearly follow-up is recommended. Electronically signed by: Elsie Gravely MD 10/11/2024 01:27 PM EST RP Workstation: [id number]   DG Chest Portable 1 View Result Date: 10/11/2024 CLINICAL DATA:  Shortness of breath. EXAM: PORTABLE CHEST 1 VIEW COMPARISON:  Chest radiograph dated 07/11/2016. FINDINGS: Cardiomegaly with vascular congestion. No focal consolidation or pleural effusion or pneumothorax. Median sternotomy wires and CABG vascular clips. No acute osseous pathology. IMPRESSION: Cardiomegaly with vascular congestion. No focal consolidation. Electronically Signed   By: Vanetta Chou M.D.   On: 10/11/2024 10:26     ECHO pending  TELEMETRY (personally reviewed): NSR rate 70s, AF/AFL overnight  EKG (personally reviewed):  NSR rate 80 bpm  DATA reviewed by me 10/13/2024: last 24h vitals tele labs imaging I/O, hospitalist progress note  Principal Problem:   CHF (congestive heart failure) (HCC) Active Problems:   Acute on chronic diastolic CHF (congestive heart failure) (HCC)   Acute decompensated heart failure (HCC)    ASSESSMENT AND PLAN: BINH DOTEN is a 58 y.o. male with a past medical history of CAD (01/2013 CABG, 03/2013 PCI of 3 VG BP, 03/2020 PCI of ISR of SVG-OM2), bicuspid AV/Severe AS s/p TAVR 02/2017, dilated aortic root, HTN and dyslipidemia who presented to the ED on 10/11/2024 for shortness of breath. Cardiology was consulted for further evaluation.   # Acute on chronic HFpEF # New onset atrial fibrillation/flutter # Coronary artery disease # Hx TAVR 2018 # Hypertension Patient presented with complaints of SOB, BNP elevated at 2000 and CXR with possible edema. Trops mild and flat. Overnight noted to be in AF/AFL which he has no hx of.  -Echo pending, further recommendations pending these results.  -Start farxiga  10 mg daily. Continue spironolactone  12.5 mg daily. Further additions to GDMT pending BP, renal function.  -Continue IV lasix  40 mg twice daily. Home metoprolol held due to bradycardia.  -Continue eliquis  5 mg twice daily for stroke risk reduction.   -Continue atorvastatin  80 mg daily and zetia  10 mg daily. Continue plavix  75  mg daily. -Continue imdur  30 mg BID, amlodipine  5 mg daily.   This patient's case was discussed and created with Dr. Florencio and he is in agreement.  Signed:  Danita Bloch, PA-C  10/13/2024, 8:48 AM [facility] Cardiology

## 2024-10-13 NOTE — ED Notes (Signed)
 Spoke with hospitalist who is aware of rhythm change to slow a flutter.

## 2024-10-13 NOTE — ED Notes (Signed)
 Pt given meal

## 2024-10-13 NOTE — ED Notes (Signed)
 Turned oxygen from 3L to 1.5L [place] per cardiology order to wean off O2.

## 2024-10-14 ENCOUNTER — Other Ambulatory Visit: Payer: Self-pay

## 2024-10-14 DIAGNOSIS — I5031 Acute diastolic (congestive) heart failure: Secondary | ICD-10-CM | POA: Diagnosis not present

## 2024-10-14 LAB — BASIC METABOLIC PANEL WITH GFR
Anion gap: 15 (ref 5–15)
BUN: 26 mg/dL — ABNORMAL HIGH (ref 6–20)
CO2: 27 mmol/L (ref 22–32)
Calcium: 8.9 mg/dL (ref 8.9–10.3)
Chloride: 96 mmol/L — ABNORMAL LOW (ref 98–111)
Creatinine, Ser: 1.26 mg/dL — ABNORMAL HIGH (ref 0.61–1.24)
GFR, Estimated: >60 mL/min (ref >60)
Glucose, Bld: 112 mg/dL — ABNORMAL HIGH (ref 70–99)
Potassium: 3.1 mmol/L — ABNORMAL LOW (ref 3.5–5.1)
Sodium: 138 mmol/L (ref 135–145)

## 2024-10-14 MED ORDER — POTASSIUM CHLORIDE CRYS ER 20 MEQ PO TBCR
40.0000 meq | EXTENDED_RELEASE_TABLET | Freq: Once | ORAL | Status: AC
  Administered 2024-10-14: 12:00:00 40 meq via ORAL
  Filled 2024-10-14: qty 2

## 2024-10-14 MED ORDER — POTASSIUM CHLORIDE CRYS ER 20 MEQ PO TBCR
40.0000 meq | EXTENDED_RELEASE_TABLET | Freq: Once | ORAL | Status: AC
  Administered 2024-10-14: 08:00:00 40 meq via ORAL
  Filled 2024-10-14: qty 2

## 2024-10-14 MED ORDER — DAPAGLIFLOZIN PROPANEDIOL 10 MG PO TABS
10.0000 mg | ORAL_TABLET | Freq: Every day | ORAL | 0 refills | Status: AC
  Filled 2024-10-14: qty 30, 30d supply, fill #0

## 2024-10-14 MED ORDER — SPIRONOLACTONE 25 MG PO TABS
25.0000 mg | ORAL_TABLET | Freq: Every day | ORAL | 0 refills | Status: AC
  Filled 2024-10-14: qty 30, 30d supply, fill #0

## 2024-10-14 MED ORDER — PNEUMOCOCCAL 20-VAL CONJ VACC 0.5 ML IM SUSY
0.5000 mL | PREFILLED_SYRINGE | INTRAMUSCULAR | Status: AC | PRN
  Administered 2024-10-14: 14:00:00 0.5 mL via INTRAMUSCULAR
  Filled 2024-10-14: qty 0.5

## 2024-10-14 MED ORDER — INFLUENZA VIRUS VACC SPLIT PF (FLUZONE) 0.5 ML IM SUSY
0.5000 mL | PREFILLED_SYRINGE | Freq: Once | INTRAMUSCULAR | Status: AC
  Administered 2024-10-14: 14:00:00 0.5 mL via INTRAMUSCULAR
  Filled 2024-10-14: qty 0.5

## 2024-10-14 MED ORDER — APIXABAN 5 MG PO TABS
5.0000 mg | ORAL_TABLET | Freq: Two times a day (BID) | ORAL | 0 refills | Status: AC
  Filled 2024-10-14: qty 60, 30d supply, fill #0

## 2024-10-14 MED ORDER — FUROSEMIDE 20 MG PO TABS
20.0000 mg | ORAL_TABLET | Freq: Every day | ORAL | 0 refills | Status: AC
  Filled 2024-10-14: qty 30, 30d supply, fill #0

## 2024-10-14 MED ORDER — FUROSEMIDE 20 MG PO TABS
20.0000 mg | ORAL_TABLET | Freq: Every day | ORAL | Status: DC
  Administered 2024-10-14: 08:00:00 20 mg via ORAL
  Filled 2024-10-14: qty 1

## 2024-10-14 MED ADMIN — Spironolactone Tab 25 MG: 25 mg | ORAL | @ 08:00:00 | NDC 60687046511

## 2024-10-14 MED FILL — Spironolactone Tab 25 MG: 25.0000 mg | ORAL | Qty: 1 | Status: AC

## 2024-10-14 NOTE — Plan of Care (Signed)

## 2024-10-14 NOTE — Progress Notes (Signed)
 [facility] CLINIC CARDIOLOGY PROGRESS NOTE   Patient ID: Robert Haley MRN: [id number] DOB/AGE: 1966/01/25 58 y.o.  Admit date: 10/11/2024 Referring Physician Dr. Cort Mana Primary Physician Valora, Lynwood FALCON, MD  Primary Cardiologist Dr. Dwain Molt Reason for Consultation AoCHF  HPI: Robert Haley is a 58 y.o. male with a past medical history of CAD (01/2013 CABG, 03/2013 PCI of 3 VG BP, 03/2020 PCI of ISR of SVG-OM2), bicuspid AV/Severe AS s/p TAVR 02/2017, dilated aortic root, HTN and dyslipidemia who presented to the ED on 10/11/2024 for shortness of breath. Cardiology was consulted for further evaluation.   Interval History:  -Patient seen and examined this AM, sitting upright on side of hospital bed.  -Feels SOB much better.  -Denies any CP or other anginal symptoms.  -Still with intermittent AFL on tele.   Review of systems complete and found to be negative unless listed above   Vitals:   10/13/24 2355 10/14/24 0335 10/14/24 0356 10/14/24 0900  BP:  (!) 152/71 139/72 (!) 115/56  Pulse: (!) 58 60 (!) 59 (!) 56  Resp: 15 20 (!) 24 18  Temp:  98 F (36.7 C) 98.3 F (36.8 C) 97.8 F (36.6 C)  TempSrc:  Oral Oral   SpO2: 93% 95% 96% 95%  Weight:      Height:         Intake/Output Summary (Last 24 hours) at 10/14/2024 1144 Last data filed at 10/13/2024 2204 Gross per 24 hour  Intake --  Output 300 ml  Net -300 ml     PHYSICAL EXAM General: Well appearing male, well nourished, in no acute distress. HEENT: Normocephalic and atraumatic. Neck: No JVD.  Lungs: Normal respiratory effort on room air. Clear bilaterally to auscultation. No wheezes, crackles, rhonchi.  Heart: HRRR. Normal S1 and S2 without gallops or murmurs. Radial & DP pulses 2+ bilaterally. Abdomen: Non-distended appearing.  Msk: Normal strength and tone for age. Extremities: No clubbing, cyanosis or edema.   Neuro: Alert and oriented X 3. Psych: Mood appropriate, affect congruent.    LABS: Basic  Metabolic Panel: Recent Labs    10/13/24 0219 10/14/24 0421  NA 138 138  K 3.7 3.1*  CL 96* 96*  CO2 32 27  GLUCOSE 101* 112*  BUN 24* 26*  CREATININE 1.17 1.26*  CALCIUM  8.8* 8.9   Liver Function Tests: No results for input(s): AST, ALT, ALKPHOS, BILITOT, PROT, ALBUMIN in the last 72 hours.  No results for input(s): LIPASE, AMYLASE in the last 72 hours. CBC: No results for input(s): WBC, NEUTROABS, HGB, HCT, MCV, PLT in the last 72 hours.  Cardiac Enzymes: No results for input(s): CKTOTAL, CKMB, CKMBINDEX, TROPONINIHS in the last 72 hours. BNP: No results for input(s): BNP in the last 72 hours. D-Dimer: No results for input(s): DDIMER in the last 72 hours. Hemoglobin A1C: No results for input(s): HGBA1C in the last 72 hours. Fasting Lipid Panel: No results for input(s): CHOL, HDL, LDLCALC, TRIG, CHOLHDL, LDLDIRECT in the last 72 hours. Thyroid Function Tests: Recent Labs    10/11/24 1328  TSH 2.630   Anemia Panel: No results for input(s): VITAMINB12, FOLATE, FERRITIN, TIBC, IRON, RETICCTPCT in the last 72 hours.  No results found.    ECHO as above  TELEMETRY (personally reviewed): AFL 50s  EKG (personally reviewed): NSR rate 80 bpm  DATA reviewed by me 10/14/2024: last 24h vitals tele labs imaging I/O, hospitalist progress note  Principal Problem:   CHF (congestive heart failure) (HCC) Active Problems:  Acute on chronic diastolic CHF (congestive heart failure) (HCC)   Acute decompensated heart failure (HCC)    ASSESSMENT AND PLAN: BESNIK FEBUS is a 58 y.o. male with a past medical history of CAD (01/2013 CABG, 03/2013 PCI of 3 VG BP, 03/2020 PCI of ISR of SVG-OM2), bicuspid AV/Severe AS s/p TAVR 02/2017, dilated aortic root, HTN and dyslipidemia who presented to the ED on 10/11/2024 for shortness of breath. Cardiology was consulted for further evaluation.   # Acute on chronic HFpEF # New  onset atrial fibrillation/flutter # Coronary artery disease # Hx TAVR 2018 # Hypertension Patient presented with complaints of SOB, BNP elevated at 2000 and CXR with possible edema. Trops mild and flat. Overnight noted to be in AF/AFL which he has no hx of. Echo this admission with EF 50-55%, no WMAs, mild MR. -Transition to PO lasix  20 mg daily.  -Continue farxiga  10 mg daily, spironolactone  12.5 mg daily. Home metoprolol held due to bradycardia - consider resuming outpatient as HR allows. -Continue eliquis  5 mg twice daily for stroke risk reduction.   -Continue atorvastatin  80 mg daily and zetia  10 mg daily. Continue plavix  75 mg daily. -Continue imdur  30 mg BID, amlodipine  5 mg daily.   Ok for discharge today from a cardiac perspective. Will arrange for follow up in clinic with Dr. Joshua ([facility]) in 1-2 weeks.    This patient's case was discussed and created with Dr. Ammon and he is in agreement.  Signed:  Danita Bloch, PA-C  10/14/2024, 11:44 AM [facility] Cardiology

## 2024-10-14 NOTE — Discharge Summary (Signed)
 Physician Discharge Summary  Robert Haley FMW:[id number] DOB: 1966-08-16 DOA: 10/11/2024  PCP: Valora Lynwood FALCON, MD  Admit date: 10/11/2024 Discharge date: 10/14/2024  Admitted From: Home Disposition:  Home  Recommendations for Outpatient Follow-up:  Follow up with PCP in 1-2 weeks Follow up with [facility] cardiology  Home Health:No  Equipment/Devices:None   Discharge Condition:Stable  CODE STATUS:FULL  Diet recommendation: Heart healthy  Brief/Interim Summary:  58 y.o. male with medical history significant of CAD status post CABG and stenting, HTN, HLD, OSA on CPAP at bedtime, presented with increasing shortness of breath.   Symptoms started about 2 weeks ago when patient started to have increasing exertional dyspnea, gradually getting worse, yesterday even walking into his home health close significant shortness of breath.  Denies any cough no chest pain.  No ankle edema.  He also admitted increasing orthopnea.  Denies any lightheadedness palpitations.  He has a history of multivessel CAD status post CABG and multiple stenting, followed with [facility] cardiology and had annual stress test May this year which showed normal stress test.  Denied any recent medication changes. ED Course: Heart rate ranging from 40-60, CTA negative for PE but signs of multifocal pneumonia versus pulmonary vascular congestion, WBC 7.1 hemoglobin 14.9, TSH 2.6, troponin 36> 40.      Discharge Diagnoses:  Principal Problem:   CHF (congestive heart failure) (HCC) Active Problems:   Acute on chronic diastolic CHF (congestive heart failure) (HCC)   Acute decompensated heart failure (HCC) Acute CHF decompensation Symptomatic bradycardia Shortness of breath, BNP elevated.  Clinically consistent with acute decompensated COPD Plan: Patient responded to intravenous diuretics.  De-escalate to oral regimen at time of discharge.  Recommend Lasix  20 mg p.o. daily, Aldactone  25 mg daily, Farxiga  10 mg daily.  Hold home  metoprolol until further discussion with established outpatient cardiology.  Of note, I received a message on 12/17 that patient had spoken with his outpatient cardiologist at [facility] who is demanding that we transfer the patient to his service.  When I called [facility] transfer center I was unable to speak with the cardiologist however was later informed that the cardiologist had spoken with the patient and patient wished to remain in [place] to complete his inpatient treatment.  Upon further conversation with the patient I was informed that our cardiology team was instructed to speak with his [facility] cardiologist and unfortunately that did not happen.  Will route this note to Dr.Schuyler Jones.      New onset atrial fibrillation/flutter Currently rate controlled Apparently had a bradycardic reaction to metoprolol Plan: Hold metoprolol at discharge Continue Eliquis    CAD HTN Continue Imdur  and amlodipine  Hold metoprolol Continue aspirin  Plavix  Continue statin No plans for ischemic evaluation at this time   Elevated troponins No significant delta.  Suspect supply/demand ischemia in the setting of decompensated CHF and new onset heart failure   Morbid obesity BMI 50.  Complicates overall care and prognosis.  Discharge Instructions  Discharge Instructions     Increase activity slowly   Complete by: As directed       Allergies as of 10/14/2024       Reactions   Morphine Nausea Only   Definity [perflutren Lipid Microsphere]    Morphine And Codeine         Medication List     PAUSE taking these medications    metoprolol tartrate 50 MG tablet Wait to take this until your doctor or other care provider tells you to start again. Commonly known as: LOPRESSOR  Take 50 mg by mouth 2 (two) times daily.       STOP taking these medications    aspirin  81 MG tablet   buPROPion 150 MG 12 hr tablet Commonly known as: WELLBUTRIN SR   multivitamin tablet   OMEGA-3 FATTY ACIDS  PO   semaglutide-weight management 0.25 MG/0.5ML Soaj SQ injection Commonly known as: WEGOVY   zinc gluconate 50 MG tablet       TAKE these medications    amLODipine  5 MG tablet Commonly known as: NORVASC  Take 5 mg by mouth daily.   apixaban  5 MG Tabs tablet Commonly known as: ELIQUIS  Take 1 tablet (5 mg total) by mouth 2 (two) times daily.   atorvastatin  80 MG tablet Commonly known as: LIPITOR Take 80 mg by mouth daily.   clopidogrel  75 MG tablet Commonly known as: PLAVIX  Take 75 mg by mouth daily.   dapagliflozin  propanediol 10 MG Tabs tablet Commonly known as: FARXIGA  Take 1 tablet (10 mg total) by mouth daily. Start taking on: October 15, 2024   ezetimibe  10 MG tablet Commonly known as: ZETIA  Take 10 mg by mouth daily.   furosemide  20 MG tablet Commonly known as: LASIX  Take 1 tablet (20 mg total) by mouth daily. Start taking on: October 15, 2024   isosorbide  mononitrate 30 MG 24 hr tablet Commonly known as: IMDUR  Take 30 mg by mouth 2 (two) times daily.   nitroGLYCERIN 0.4 MG SL tablet Commonly known as: NITROSTAT Place under the tongue.   sertraline  100 MG tablet Commonly known as: ZOLOFT  Take 100 mg by mouth daily.   spironolactone  25 MG tablet Commonly known as: ALDACTONE  Take 1 tablet (25 mg total) by mouth daily. Start taking on: October 15, 2024   testosterone cypionate 200 MG/ML injection Commonly known as: DEPOTESTOSTERONE CYPIONATE SMARTSIG:Milliliter(s) IM        Follow-up Information     Valora Lynwood FALCON, MD. Schedule an appointment as soon as possible for a visit .   Specialty: Family Medicine Contact information: [address] [place] [address] [phone]         [facility] Emergency Department at [facility] .   Specialty: Emergency Medicine Why: If symptoms worsen Contact information: [address]  [place]  [address] [phone]        Joshua Elsie RAMAN, MD. Go in 1 week(s).   Specialty: Internal Medicine Contact information: [address] [facility] [place] [address] [phone]                Allergies[1]  Consultations: Cardiology- Maryl   Procedures/Studies: ECHOCARDIOGRAM COMPLETE Result Date: 10/13/2024    ECHOCARDIOGRAM REPORT   Patient Name:   Robert Haley Date of Exam: 10/12/2024 Medical Rec #:  [id number]    Height:       68.0 in Accession #:    [id number]   Weight:       330.0 lb Date of Birth:  [birth year]    BSA:          2.528 m Patient Age:    58 years     BP:           99/50 mmHg Patient Gender: M            HR:           58 bpm. Exam Location:  [facility] Procedure: 2D Echo, Cardiac Doppler and Color Doppler (Both Spectral and Color  Flow Doppler were utilized during procedure). Indications:     CHF  History:         Patient has no prior history of Echocardiogram examinations.                  CHF, CAD, Aortic Valve Disease, Allergies:Definity; Risk                  Factors:Sleep Apnea and Dyslipidemia.                  Aortic Valve: valve is present in the aortic position.                  Procedure Date: 5/22/218.  Sonographer:     Philomena Daring Referring Phys:  [id number] CORT ONEIDA MANA Diagnosing Phys: Cara JONETTA Lovelace MD  Sonographer Comments: Technically challenging study due to limited acoustic windows, Technically difficult study due to poor echo windows and patient is obese. Definity not used due to patient allergy. IMPRESSIONS  1. Technically difficult study.  2. Left ventricular ejection fraction, by estimation, is 50 to 55%. The left ventricle has low normal function. The left ventricle has no regional wall motion abnormalities. Left ventricular diastolic function could not be evaluated.  3. Right ventricular systolic function is normal. The right ventricular size is normal.  4. The mitral valve was not well visualized. Mild mitral valve regurgitation.  5. The aortic valve was not well visualized. Aortic valve  regurgitation is not visualized. There is a valve present in the aortic position. Procedure Date: 5/22/218. Conclusion(s)/Recommendation(s): Poor windows for evaluation of left ventricular function by transthoracic echocardiography. Would recommend an alternative means of evaluation. FINDINGS  Left Ventricle: Left ventricular ejection fraction, by estimation, is 50 to 55%. The left ventricle has low normal function. The left ventricle has no regional wall motion abnormalities. Strain was performed and the global longitudinal strain is indeterminate. The left ventricular internal cavity size was normal in size. There is borderline left ventricular hypertrophy. Left ventricular diastolic function could not be evaluated. Right Ventricle: The right ventricular size is normal. No increase in right ventricular wall thickness. Right ventricular systolic function is normal. Left Atrium: Left atrial size was not well visualized. Right Atrium: Right atrial size was not well visualized. Pericardium: There is no evidence of pericardial effusion. Mitral Valve: The mitral valve was not well visualized. Mild mitral valve regurgitation. Tricuspid Valve: The tricuspid valve is normal in structure. Tricuspid valve regurgitation is mild. Aortic Valve: The aortic valve was not well visualized. Aortic valve regurgitation is not visualized. There is a valve present in the aortic position. Procedure Date: 5/22/218. Pulmonic Valve: The pulmonic valve was not well visualized. Pulmonic valve regurgitation is not visualized. Aorta: Aortic root could not be assessed. IAS/Shunts: The interatrial septum was not well visualized. Additional Comments: Technically difficult study. 3D was performed not requiring image post processing on an independent workstation and was indeterminate.   Diastology LV e' medial:    6.74 cm/s LV E/e' medial:  21.1 LV e' lateral:   9.03 cm/s LV E/e' lateral: 15.7  RIGHT VENTRICLE            IVC RV S prime:     9.90  cm/s  IVC diam: 2.20 cm TAPSE (M-mode): 1.7 cm LEFT ATRIUM             Index        RIGHT ATRIUM           Index LA  Vol (A2C):   61.3 ml 24.24 ml/m  RA Area:     18.90 cm LA Vol (A4C):   53.1 ml 21.00 ml/m  RA Volume:   46.80 ml  18.51 ml/m LA Biplane Vol: 58.0 ml 22.94 ml/m  AORTIC VALVE LVOT Vmax:   136.00 cm/s LVOT Vmean:  93.250 cm/s LVOT VTI:    0.309 m MITRAL VALVE MV Area (PHT): 5.75 cm     SHUNTS MV Decel Time: 132 msec     Systemic VTI: 0.31 m MV E velocity: 142.00 cm/s Cara JONETTA Lovelace MD Electronically signed by Cara JONETTA Lovelace MD Signature Date/Time: 10/13/2024/9:45:07 AM    Final    CT Angio Chest PE W and/or Wo Contrast Result Date: 10/11/2024 EXAM: CTA of the Chest with contrast for PE 10/11/2024 12:57:40 PM TECHNIQUE: CTA of the chest was performed after the administration of intravenous contrast. Multiplanar reformatted images are provided for review. MIP images are provided for review. Automated exposure control, iterative reconstruction, and/or weight based adjustment of the mA/kV was utilized to reduce the radiation dose to as low as reasonably achievable. COMPARISON: Chest radiograph 10/11/2024 and CT chest 02/06/2024. CLINICAL HISTORY: Pulmonary embolism (PE) suspected, high prob. Worsening shortness of breath for 10 days. FINDINGS: PULMONARY ARTERIES: Moderate opacification of the central and segmental pulmonary arteries with mild motion artifact. No focal filling defects are identified. No evidence of significant pulmonary embolism. Main pulmonary artery is normal in caliber. MEDIASTINUM: Postoperative changes with aortic valve replacement, median sternotomy, and coronary artery bypass grafts. Normal heart size. No pericardial effusions. 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 The thyroid gland is unremarkable. The esophagus is decompressed. LYMPH NODES: The mediastinal lymph nodes are not pathologically enlarged, likely reactive. No hilar or  axillary lymphadenopathy. LUNGS AND PLEURA: Motion artifact limits evaluation of the lungs. There is diffuse infiltration throughout the left lung base with focal areas of infiltration in the right lung base. This is likely multifocal pneumonia. There is a small left pleural effusion. No pneumothorax. UPPER ABDOMEN: Visualized upper abdominal contents are unremarkable. SOFT TISSUES AND BONES: No acute bone or soft tissue abnormality. IMPRESSION: 1. No evidence of significant pulmonary embolism. 2. Likely multifocal pneumonia. Motion artifact limits evaluation of the lungs. 3. Small left pleural effusion. 4. 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 Yearly follow-up is recommended. Electronically signed by: Elsie Gravely MD 10/11/2024 01:27 PM EST RP Workstation: [id number]   DG Chest Portable 1 View Result Date: 10/11/2024 CLINICAL DATA:  Shortness of breath. EXAM: PORTABLE CHEST 1 VIEW COMPARISON:  Chest radiograph dated 07/11/2016. FINDINGS: Cardiomegaly with vascular congestion. No focal consolidation or pleural effusion or pneumothorax. Median sternotomy wires and CABG vascular clips. No acute osseous pathology. IMPRESSION: Cardiomegaly with vascular congestion. No focal consolidation. Electronically Signed   By: Vanetta Chou M.D.   On: 10/11/2024 10:26      Subjective: Seen and no distress.  Appropriate for discharge home.  Discharge Exam: Vitals:   10/14/24 0356 10/14/24 0900  BP: 139/72 (!) 115/56  Pulse: (!) 59 (!) 56  Resp: (!) 24 18  Temp: 98.3 F (36.8 C) 97.8 F (36.6 C)  SpO2: 96% 95%   Vitals:   10/13/24 2355 10/14/24 0335 10/14/24 0356 10/14/24 0900  BP:  (!) 152/71 139/72 (!) 115/56  Pulse: (!) 58 60 (!) 59 (!) 56  Resp: 15 20 (!) 24 18  Temp:  98 F (36.7 C) 98.3 F (36.8 C) 97.8 F (36.6  C)  TempSrc:  Oral Oral   SpO2: 93% 95% 96% 95%  Weight:      Height:        General: Pt is alert, awake, not in acute  distress Cardiovascular: RRR, S1/S2 +, no rubs, no gallops Respiratory: CTA bilaterally, no wheezing, no rhonchi Abdominal: Soft, NT, ND, bowel sounds + Extremities: no edema, no cyanosis    The results of significant diagnostics from this hospitalization (including imaging, microbiology, ancillary and laboratory) are listed below for reference.     Microbiology: Recent Results (from the past 240 hours)  Resp panel by RT-PCR (RSV, Flu A&B, Covid) Anterior Nasal Swab     Status: None   Collection Time: 10/11/24  9:59 AM   Specimen: Anterior Nasal Swab  Result Value Ref Range Status   SARS Coronavirus 2 by RT PCR NEGATIVE NEGATIVE Final    Comment: (NOTE) SARS-CoV-2 target nucleic acids are NOT DETECTED.  The SARS-CoV-2 RNA is generally detectable in upper respiratory specimens during the acute phase of infection. The lowest concentration of SARS-CoV-2 viral copies this assay can detect is 138 copies/mL. A negative result does not preclude SARS-Cov-2 infection and should not be used as the sole basis for treatment or other patient management decisions. A negative result may occur with  improper specimen collection/handling, submission of specimen other than nasopharyngeal swab, presence of viral mutation(s) within the areas targeted by this assay, and inadequate number of viral copies(<138 copies/mL). A negative result must be combined with clinical observations, patient history, and epidemiological information. The expected result is Negative.  Fact Sheet for Patients:  bloggercourse.com  Fact Sheet for Healthcare Providers:  seriousbroker.it  This test is no t yet approved or cleared by the [place]  FDA and  has been authorized for detection and/or diagnosis of SARS-CoV-2 by FDA under an Emergency Use Authorization (EUA). This EUA will remain  in effect (meaning this test can be used) for the duration of the COVID-19  declaration under Section 564(b)(1) of the Act, 21 U.S.C.section 360bbb-3(b)(1), unless the authorization is terminated  or revoked sooner.       Influenza A by PCR NEGATIVE NEGATIVE Final   Influenza B by PCR NEGATIVE NEGATIVE Final    Comment: (NOTE) The Xpert Xpress SARS-CoV-2/FLU/RSV plus assay is intended as an aid in the diagnosis of influenza from Nasopharyngeal swab specimens and should not be used as a sole basis for treatment. Nasal washings and aspirates are unacceptable for Xpert Xpress SARS-CoV-2/FLU/RSV testing.  Fact Sheet for Patients: bloggercourse.com  Fact Sheet for Healthcare Providers: seriousbroker.it  This test is not yet approved or cleared by the [place]  FDA and has been authorized for detection and/or diagnosis of SARS-CoV-2 by FDA under an Emergency Use Authorization (EUA). This EUA will remain in effect (meaning this test can be used) for the duration of the COVID-19 declaration under Section 564(b)(1) of the Act, 21 U.S.C. section 360bbb-3(b)(1), unless the authorization is terminated or revoked.     Resp Syncytial Virus by PCR NEGATIVE NEGATIVE Final    Comment: (NOTE) Fact Sheet for Patients: bloggercourse.com  Fact Sheet for Healthcare Providers: seriousbroker.it  This test is not yet approved or cleared by the [place]  FDA and has been authorized for detection and/or diagnosis of SARS-CoV-2 by FDA under an Emergency Use Authorization (EUA). This EUA will remain in effect (meaning this test can be used) for the duration of the COVID-19 declaration under Section 564(b)(1) of the Act, 21 U.S.C. section 360bbb-3(b)(1), unless the authorization  is terminated or revoked.  Performed at [facility], [address] [place], [place] [address]   Respiratory (~20 pathogens) panel by PCR     Status: None   Collection Time:  10/12/24  8:16 AM   Specimen: Nasopharyngeal Swab; Respiratory  Result Value Ref Range Status   Adenovirus NOT DETECTED NOT DETECTED Final   Coronavirus 229E NOT DETECTED NOT DETECTED Final    Comment: (NOTE) The Coronavirus on the Respiratory Panel, DOES NOT test for the novel  Coronavirus (2019 nCoV)    Coronavirus HKU1 NOT DETECTED NOT DETECTED Final   Coronavirus NL63 NOT DETECTED NOT DETECTED Final   Coronavirus OC43 NOT DETECTED NOT DETECTED Final   Metapneumovirus NOT DETECTED NOT DETECTED Final   Rhinovirus / Enterovirus NOT DETECTED NOT DETECTED Final   Influenza A NOT DETECTED NOT DETECTED Final   Influenza B NOT DETECTED NOT DETECTED Final   Parainfluenza Virus 1 NOT DETECTED NOT DETECTED Final   Parainfluenza Virus 2 NOT DETECTED NOT DETECTED Final   Parainfluenza Virus 3 NOT DETECTED NOT DETECTED Final   Parainfluenza Virus 4 NOT DETECTED NOT DETECTED Final   Respiratory Syncytial Virus NOT DETECTED NOT DETECTED Final   Bordetella pertussis NOT DETECTED NOT DETECTED Final   Bordetella Parapertussis NOT DETECTED NOT DETECTED Final   Chlamydophila pneumoniae NOT DETECTED NOT DETECTED Final   Mycoplasma pneumoniae NOT DETECTED NOT DETECTED Final    Comment: Performed at [facility], [address], [place], [place] [address]     Labs: BNP (last 3 results) No results for input(s): BNP in the last 8760 hours. Basic Metabolic Panel: Recent Labs  Lab 10/11/24 0959 10/12/24 0320 10/13/24 0219 10/14/24 0421  NA 139 141 138 138  K 4.8 3.8 3.7 3.1*  CL 102 101 96* 96*  CO2 27 30 32 27  GLUCOSE 113* 96 101* 112*  BUN 16 20 24* 26*  CREATININE 1.00 1.16 1.17 1.26*  CALCIUM  9.2 8.8* 8.8* 8.9   Liver Function Tests: Recent Labs  Lab 10/11/24 0959  AST 38  ALT 34  ALKPHOS 114  BILITOT 1.3*  PROT 6.9  ALBUMIN 4.2   No results for input(s): LIPASE, AMYLASE in the last 168 hours. No results for input(s): AMMONIA in the last 168 hours. CBC: Recent  Labs  Lab 10/11/24 0959  WBC 7.1  NEUTROABS 5.6  HGB 14.9  HCT 45.3  MCV 96.0  PLT 189   Cardiac Enzymes: No results for input(s): CKTOTAL, CKMB, CKMBINDEX, TROPONINI in the last 168 hours. BNP: Invalid input(s): POCBNP CBG: No results for input(s): GLUCAP in the last 168 hours. D-Dimer No results for input(s): DDIMER in the last 72 hours. Hgb A1c No results for input(s): HGBA1C in the last 72 hours. Lipid Profile No results for input(s): CHOL, HDL, LDLCALC, TRIG, CHOLHDL, LDLDIRECT in the last 72 hours. Thyroid function studies Recent Labs    10/11/24 1328  TSH 2.630   Anemia work up No results for input(s): VITAMINB12, FOLATE, FERRITIN, TIBC, IRON, RETICCTPCT in the last 72 hours. Urinalysis    Component Value Date/Time   COLORURINE STRAW (A) 10/11/2024 1643   APPEARANCEUR CLEAR (A) 10/11/2024 1643   LABSPEC 1.013 10/11/2024 1643   PHURINE 5.0 10/11/2024 1643   GLUCOSEU NEGATIVE 10/11/2024 1643   HGBUR NEGATIVE 10/11/2024 1643   BILIRUBINUR NEGATIVE 10/11/2024 1643   KETONESUR NEGATIVE 10/11/2024 1643   PROTEINUR NEGATIVE 10/11/2024 1643   NITRITE NEGATIVE 10/11/2024 1643   LEUKOCYTESUR NEGATIVE 10/11/2024 1643   Sepsis  Labs Recent Labs  Lab 10/11/24 0959  WBC 7.1   Microbiology Recent Results (from the past 240 hours)  Resp panel by RT-PCR (RSV, Flu A&B, Covid) Anterior Nasal Swab     Status: None   Collection Time: 10/11/24  9:59 AM   Specimen: Anterior Nasal Swab  Result Value Ref Range Status   SARS Coronavirus 2 by RT PCR NEGATIVE NEGATIVE Final    Comment: (NOTE) SARS-CoV-2 target nucleic acids are NOT DETECTED.  The SARS-CoV-2 RNA is generally detectable in upper respiratory specimens during the acute phase of infection. The lowest concentration of SARS-CoV-2 viral copies this assay can detect is 138 copies/mL. A negative result does not preclude SARS-Cov-2 infection and should not be used as the sole  basis for treatment or other patient management decisions. A negative result may occur with  improper specimen collection/handling, submission of specimen other than nasopharyngeal swab, presence of viral mutation(s) within the areas targeted by this assay, and inadequate number of viral copies(<138 copies/mL). A negative result must be combined with clinical observations, patient history, and epidemiological information. The expected result is Negative.  Fact Sheet for Patients:  bloggercourse.com  Fact Sheet for Healthcare Providers:  seriousbroker.it  This test is no t yet approved or cleared by the [place]  FDA and  has been authorized for detection and/or diagnosis of SARS-CoV-2 by FDA under an Emergency Use Authorization (EUA). This EUA will remain  in effect (meaning this test can be used) for the duration of the COVID-19 declaration under Section 564(b)(1) of the Act, 21 U.S.C.section 360bbb-3(b)(1), unless the authorization is terminated  or revoked sooner.       Influenza A by PCR NEGATIVE NEGATIVE Final   Influenza B by PCR NEGATIVE NEGATIVE Final    Comment: (NOTE) The Xpert Xpress SARS-CoV-2/FLU/RSV plus assay is intended as an aid in the diagnosis of influenza from Nasopharyngeal swab specimens and should not be used as a sole basis for treatment. Nasal washings and aspirates are unacceptable for Xpert Xpress SARS-CoV-2/FLU/RSV testing.  Fact Sheet for Patients: bloggercourse.com  Fact Sheet for Healthcare Providers: seriousbroker.it  This test is not yet approved or cleared by the [place]  FDA and has been authorized for detection and/or diagnosis of SARS-CoV-2 by FDA under an Emergency Use Authorization (EUA). This EUA will remain in effect (meaning this test can be used) for the duration of the COVID-19 declaration under Section 564(b)(1) of the Act,  21 U.S.C. section 360bbb-3(b)(1), unless the authorization is terminated or revoked.     Resp Syncytial Virus by PCR NEGATIVE NEGATIVE Final    Comment: (NOTE) Fact Sheet for Patients: bloggercourse.com  Fact Sheet for Healthcare Providers: seriousbroker.it  This test is not yet approved or cleared by the [place]  FDA and has been authorized for detection and/or diagnosis of SARS-CoV-2 by FDA under an Emergency Use Authorization (EUA). This EUA will remain in effect (meaning this test can be used) for the duration of the COVID-19 declaration under Section 564(b)(1) of the Act, 21 U.S.C. section 360bbb-3(b)(1), unless the authorization is terminated or revoked.  Performed at [facility], [address] [place], [place] [address]   Respiratory (~20 pathogens) panel by PCR     Status: None   Collection Time: 10/12/24  8:16 AM   Specimen: Nasopharyngeal Swab; Respiratory  Result Value Ref Range Status   Adenovirus NOT DETECTED NOT DETECTED Final   Coronavirus 229E NOT DETECTED NOT DETECTED Final    Comment: (NOTE) The Coronavirus on the Respiratory  Panel, DOES NOT test for the novel  Coronavirus (2019 nCoV)    Coronavirus HKU1 NOT DETECTED NOT DETECTED Final   Coronavirus NL63 NOT DETECTED NOT DETECTED Final   Coronavirus OC43 NOT DETECTED NOT DETECTED Final   Metapneumovirus NOT DETECTED NOT DETECTED Final   Rhinovirus / Enterovirus NOT DETECTED NOT DETECTED Final   Influenza A NOT DETECTED NOT DETECTED Final   Influenza B NOT DETECTED NOT DETECTED Final   Parainfluenza Virus 1 NOT DETECTED NOT DETECTED Final   Parainfluenza Virus 2 NOT DETECTED NOT DETECTED Final   Parainfluenza Virus 3 NOT DETECTED NOT DETECTED Final   Parainfluenza Virus 4 NOT DETECTED NOT DETECTED Final   Respiratory Syncytial Virus NOT DETECTED NOT DETECTED Final   Bordetella pertussis NOT DETECTED NOT DETECTED Final   Bordetella  Parapertussis NOT DETECTED NOT DETECTED Final   Chlamydophila pneumoniae NOT DETECTED NOT DETECTED Final   Mycoplasma pneumoniae NOT DETECTED NOT DETECTED Final    Comment: Performed at [facility], [address] [place], [place] [address]     Time coordinating discharge: 40 minutes   SIGNED:   Calvin KATHEE Robson, MD  [facility] 10/14/2024, 11:45 AM Pager   If 7PM-7AM, please contact night-coverage     [1]  Allergies Allergen Reactions   Morphine Nausea Only   Definity [Perflutren Lipid Microsphere]    Morphine And Codeine
